# Patient Record
Sex: Female | Born: 1985 | Race: White | Hispanic: No | Marital: Married | Smoking: Never smoker
Health system: Southern US, Community
[De-identification: ages and names within clinical notes are randomized; demographics above are authoritative.]

## PROBLEM LIST (undated history)

## (undated) ENCOUNTER — Inpatient Hospital Stay (HOSPITAL_COMMUNITY): Payer: Self-pay

## (undated) DIAGNOSIS — Z789 Other specified health status: Secondary | ICD-10-CM

## (undated) DIAGNOSIS — E237 Disorder of pituitary gland, unspecified: Secondary | ICD-10-CM

## (undated) DIAGNOSIS — D649 Anemia, unspecified: Secondary | ICD-10-CM

## (undated) HISTORY — PX: WISDOM TOOTH EXTRACTION: SHX21

---

## 2015-06-03 LAB — OB RESULTS CONSOLE ABO/RH: RH Type: POSITIVE

## 2015-06-03 LAB — OB RESULTS CONSOLE HIV ANTIBODY (ROUTINE TESTING): HIV: NONREACTIVE

## 2015-06-03 LAB — OB RESULTS CONSOLE RUBELLA ANTIBODY, IGM: Rubella: IMMUNE

## 2015-06-03 LAB — OB RESULTS CONSOLE RPR: RPR: NONREACTIVE

## 2015-06-03 LAB — OB RESULTS CONSOLE HEPATITIS B SURFACE ANTIGEN: Hepatitis B Surface Ag: NEGATIVE

## 2015-06-03 LAB — OB RESULTS CONSOLE ANTIBODY SCREEN: ANTIBODY SCREEN: NEGATIVE

## 2015-10-18 ENCOUNTER — Inpatient Hospital Stay (HOSPITAL_COMMUNITY): Admission: AD | Admit: 2015-10-18 | Payer: 59 | Source: Ambulatory Visit | Admitting: Obstetrics and Gynecology

## 2015-10-18 ENCOUNTER — Inpatient Hospital Stay (HOSPITAL_COMMUNITY): Payer: 59

## 2015-10-18 ENCOUNTER — Encounter (HOSPITAL_COMMUNITY): Payer: Self-pay | Admitting: *Deleted

## 2015-10-18 ENCOUNTER — Inpatient Hospital Stay (HOSPITAL_COMMUNITY)
Admission: AD | Admit: 2015-10-18 | Discharge: 2015-10-23 | DRG: 781 | Disposition: A | Discharge status: Home / Self Care | Payer: 59 | Source: Ambulatory Visit | Attending: Obstetrics and Gynecology | Admitting: Obstetrics and Gynecology

## 2015-10-18 DIAGNOSIS — O4403 Placenta previa specified as without hemorrhage, third trimester: Principal | ICD-10-CM | POA: Diagnosis present

## 2015-10-18 DIAGNOSIS — Z3A28 28 weeks gestation of pregnancy: Secondary | ICD-10-CM | POA: Diagnosis not present

## 2015-10-18 DIAGNOSIS — O4413 Placenta previa with hemorrhage, third trimester: Secondary | ICD-10-CM

## 2015-10-18 DIAGNOSIS — O4400 Placenta previa specified as without hemorrhage, unspecified trimester: Secondary | ICD-10-CM | POA: Diagnosis present

## 2015-10-18 HISTORY — DX: Other specified health status: Z78.9

## 2015-10-18 LAB — TYPE AND SCREEN
ABO/RH(D): B POS
Antibody Screen: NEGATIVE

## 2015-10-18 LAB — URINALYSIS, ROUTINE W REFLEX MICROSCOPIC
BILIRUBIN URINE: NEGATIVE
Glucose, UA: NEGATIVE mg/dL
HGB URINE DIPSTICK: NEGATIVE
Ketones, ur: NEGATIVE mg/dL
Leukocytes, UA: NEGATIVE
NITRITE: NEGATIVE
PROTEIN: NEGATIVE mg/dL
Specific Gravity, Urine: 1.01 (ref 1.005–1.030)
pH: 7 (ref 5.0–8.0)

## 2015-10-18 MED ORDER — CALCIUM CARBONATE ANTACID 500 MG PO CHEW: 2.0000 | CHEWABLE_TABLET | ORAL | Status: DC | PRN

## 2015-10-18 MED ORDER — BETAMETHASONE SOD PHOS & ACET 6 (3-3) MG/ML IJ SUSP
12.0000 mg | INTRAMUSCULAR | Status: AC
  Filled 2015-10-18 (×2): qty 2

## 2015-10-18 MED ORDER — PRENATAL MULTIVITAMIN CH
1.0000 | ORAL_TABLET | Freq: Every day | ORAL | Status: DC
  Administered 2015-10-19 – 2015-10-22 (×4): 1 via ORAL
  Filled 2015-10-18 (×4): qty 1

## 2015-10-18 MED ORDER — ZOLPIDEM TARTRATE 5 MG PO TABS: 5.0000 mg | ORAL_TABLET | Freq: Every evening | ORAL | Status: DC | PRN

## 2015-10-18 MED ORDER — SODIUM CHLORIDE 0.9 % IV SOLN: 250.0000 mL | INTRAVENOUS | Status: DC | PRN

## 2015-10-18 MED ORDER — SODIUM CHLORIDE 0.9 % IJ SOLN
3.0000 mL | Freq: Two times a day (BID) | INTRAMUSCULAR | Status: DC
  Administered 2015-10-18 – 2015-10-22 (×9): 3 mL via INTRAVENOUS

## 2015-10-18 MED ORDER — DOCUSATE SODIUM 100 MG PO CAPS
100.0000 mg | ORAL_CAPSULE | Freq: Every day | ORAL | Status: DC
  Administered 2015-10-18 – 2015-10-22 (×5): 100 mg via ORAL
  Filled 2015-10-18 (×6): qty 1

## 2015-10-18 MED ORDER — ACETAMINOPHEN 325 MG PO TABS: 650.0000 mg | ORAL_TABLET | ORAL | Status: DC | PRN

## 2015-10-18 MED ORDER — SODIUM CHLORIDE 0.9 % IJ SOLN: 3.0000 mL | INTRAMUSCULAR | Status: DC | PRN

## 2015-10-18 NOTE — H&P (Addendum)
Ellen Bates is a 29 y.o. female G1P0 @ 28+5 wks with placenta previa presenting for vaginal bleeding.  Pt has had occasional spotting but had an episode of small bleeding this am when using BR followed by small clot.  No ctx, pain, or LOF. History OB History    Gravida Para Term Preterm AB TAB SAB Ectopic Multiple Living   1              Past Medical History  Diagnosis Date  . Medical history non-contributory    Past Surgical History  Procedure Laterality Date  . Wisdom tooth extraction     Family History: family history includes Hyperlipidemia in her mother. Social History:  reports that she has never smoked. She has never used smokeless tobacco. She reports that she does not drink alcohol or use illicit drugs.   Prenatal Transfer Tool  Maternal Diabetes: No Genetic Screening: Declined Maternal Ultrasounds/Referrals: Normal Fetal Ultrasounds or other Referrals:  None Maternal Substance Abuse:  No Significant Maternal Medications:  None Significant Maternal Lab Results:  None Other Comments:  None  ROS    Blood pressure 115/66, pulse 81, temperature 98.4 F (36.9 C), resp. rate 18. Exam Physical Exam  Gen - NAD Abd - gravid, NT Pv - deferred, no active bleeding Ext - NT Prenatal labs: ABO, Rh:  Rh + Antibody:   Rubella:   RPR:    HBsAg:    HIV:    GBS:     Assessment/Plan: 28 +5 weeks with complete placenta previa.  Vaginal bleeding today Admit BMZ Plan of care discussed with pt and husband    Ellen Bates 10/18/2015, 9:04 PM

## 2015-10-18 NOTE — MAU Note (Signed)
Hx of placenta previa. About 0900 today had episode of bright blood when going to BR and then small clot on tissue. Rest of day has been more brown on tissue. Was out of town so could not come in earlier. No pain. Good FM

## 2015-10-18 NOTE — Progress Notes (Signed)
Alvino ChapelJo RN went in to administer betamethasone, Pt wants to talk with Dr Renaldo FiddlerAdkins first about betamethasone.  Will notify her.

## 2015-10-18 NOTE — MAU Provider Note (Signed)
History     CSN: [id number]  Arrival date and time: 10/18/15 [id number]   First Provider Initiated Contact with Patient 10/18/15 2017      Chief Complaint  Patient presents with  . Vaginal Bleeding   HPI [name] is a 29 y.o. G1P0 at 6760w5d who presents to MAU today with complaint of vaginal bleeding. She states that she has a complete previa diagnosed at 18 weeks. The states only intermittent spotting prior to today. This morning she noted blood in the toilet and then a small amount of wiping. The next time she used the bathroom she noted a nickel sized clots with wiping. She has not required a pad. She continues to see intermittent brown blood with wiping since then. She denies pain, contractions or LOF. She reports good fetal movement.   OB History    Gravida Para Term Preterm AB TAB SAB Ectopic Multiple Living   1               Past Medical History  Diagnosis Date  . Medical history non-contributory     Past Surgical History  Procedure Laterality Date  . Wisdom tooth extraction      Family History  Problem Relation Age of Onset  . Hyperlipidemia Mother     Social History  Substance Use Topics  . Smoking status: Never Smoker   . Smokeless tobacco: Never Used  . Alcohol Use: No    Allergies:  Allergies  Allergen Reactions  . Sulfa Antibiotics Hives    Prescriptions prior to admission  Medication Sig Dispense Refill Last Dose  . Prenatal Vit-Fe Fumarate-FA (PRENATAL MULTIVITAMIN) TABS tablet Take 1 tablet by mouth daily at 12 noon.   10/17/2015 at Unknown time    Review of Systems  Gastrointestinal: Negative for abdominal pain.  Genitourinary:       + vaginal bleeding   Physical Exam   Blood pressure 115/66, pulse 81, temperature 98.4 F (36.9 C), resp. rate 18.  Physical Exam  Nursing note and vitals reviewed. Constitutional: She is oriented to person, place, and time. She appears well-developed and well-nourished. No distress.  HENT:  Head:  Normocephalic and atraumatic.  Cardiovascular: Normal rate.   Respiratory: Effort normal.  GI: Soft. She exhibits no distension and no mass. There is no tenderness. There is no rebound and no guarding.  Neurological: She is alert and oriented to person, place, and time.  Skin: Skin is warm and dry. No erythema.  Psychiatric: She has a normal mood and affect.   Results for orders placed or performed during the hospital encounter of 10/18/15 (from the past 24 hour(s))  Urinalysis, Routine w reflex microscopic ([facility])     Status: None   Collection Time: 10/18/15  8:20 PM  Result Value Ref Range   Color, Urine YELLOW YELLOW   APPearance CLEAR CLEAR   Specific Gravity, Urine 1.010 1.005 - 1.030   pH 7.0 5.0 - 8.0   Glucose, UA NEGATIVE NEGATIVE mg/dL   Hgb urine dipstick NEGATIVE NEGATIVE   Bilirubin Urine NEGATIVE NEGATIVE   Ketones, ur NEGATIVE NEGATIVE mg/dL   Protein, ur NEGATIVE NEGATIVE mg/dL   Nitrite NEGATIVE NEGATIVE   Leukocytes, UA NEGATIVE NEGATIVE    Fetal Monitoring: Baseline: 150 bpm, moderate variability, + accelerations, no decelerations Contractions: moderate UI with irregular contractions; only mild UI after 2030   MAU Course  Procedures None  MDM UA today Patient encouraged to PO hydrate in MAU Pelvic exam deferred due to  placenta previa Discussed with Dr. Renaldo Fiddler. Admit to Antenatal for monitoring of bleeding due to first bleed with placenta previa during this pregnancy.   Assessment and Plan  A: SIUP at [redacted]w[redacted]d Complete placenta previa  P: Admit to Antenatal Dr. Renaldo Fiddler will enter orders Patient informed Plan for BMZ  Marny Lowenstein, PA-C  10/18/2015, 9:10 PM

## 2015-10-18 NOTE — Progress Notes (Signed)
Dr Renaldo FiddlerAdkins spoke with pt regarding plan of care, betamethasone. Pt and husband wanted to speak further alone.  Per Dr Renaldo FiddlerAdkins- can let her know in am if pt received injection. Pt to u/s at this time and hasn't decided yet.  Notified Ruthe RN so she can let MD know in am.

## 2015-10-19 LAB — ABO/RH: ABO/RH(D): B POS

## 2015-10-19 LAB — CBC
HCT: 30.1 % — ABNORMAL LOW (ref 36.0–46.0)
Hemoglobin: 10.1 g/dL — ABNORMAL LOW (ref 12.0–15.0)
MCH: 30.6 pg (ref 26.0–34.0)
MCHC: 33.6 g/dL (ref 30.0–36.0)
MCV: 91.2 fL (ref 78.0–100.0)
PLATELETS: 135 10*3/uL — AB (ref 150–400)
RBC: 3.3 MIL/uL — ABNORMAL LOW (ref 3.87–5.11)
RDW: 12.7 % (ref 11.5–15.5)
WBC: 9.7 10*3/uL (ref 4.0–10.5)

## 2015-10-19 NOTE — Progress Notes (Signed)
Pt resting comfortably.  No vb, pain, or LOF.  Good FM Pt reports episode of mild contractions approx 130am that resolved w/ void  AF, VSS Gen - NAD Abd - gravid, NT PV - deferred  A/P:  Placenta previa, 28+6 weeks Pt stable w/ no further bleeding Pt declines BMZ Continue hospital bedrest

## 2015-10-20 NOTE — Progress Notes (Signed)
No current c/o.  No more VB or CTX.  Active FM.  VSS Gen: A&O x 3 Abd: soft, NT Ext: no c/c/e FHT Cat I  29 yo G1 at 8796w0d with previa -Counseled again re: rec to administer BMZ.  Patient declines until she has spoken with NICU. -NICU consult -Bedrest -Inpt thru 5-7 days after bleed (11/26)  Mitchel HonourMegan Rolf Fells, DO

## 2015-10-20 NOTE — Consult Note (Signed)
Neonatology Consult to Antenatal Patient: 10/20/2015 10:08 AM    I was requested by Dr Langston MaskerMorris to see this patient in order to provide antenatal counseling due to  Placenta previa in a 28 6/[redacted] weeks gestation.    Ellen Bates is a 29 y/o Primgravida who was admitted on 11/26 and is now 28 6/[redacted] weeks GA.   She is currently "not" having active labor.  She has not received BMZ at the moment but this has been offered to them since admission.   I spoke with Ms.Ishibashi and FOB in Room 152.   We discussed in detail what to expect in case of possible delivery of the infant in the next few days including morbidity and mortality at this gestational age, usual delivery room resuscitation including intubation and surfactant administration in the DR.  Discussed possible respiratory complications and need for support including mechanical ventilation and the importance of mother receiving antenatal steroids right now.  Emphasized the importance of steroids for lung maturity and improved morbidity and mortality rates.  Also informed them of other complications including IV access, sepsis work-up, NG/OG feedings ( benefits of BF and MOB desires breast feeding, which was encouraged as well as availability of DBM), risk for IVH with the potential for motor/cognitive deficits, length of stay and long-term outcome.  They were attentive, had appropriate questions more about antenatal steroids, and expressed appreciation for my input.     Thank you for asking us to see this patient and allowing us to participate in her care.  Please call if there are any further questions.   Overton MamMary Ann T Dimaguila, MD (Attending Neonatologist)  Total length of face-to-face or floor/unit time for this encounter was 30 minutes. Counseling and/or coordination of care was greater than fifty percent of the time.

## 2015-10-21 LAB — TYPE AND SCREEN
ABO/RH(D): B POS
ANTIBODY SCREEN: NEGATIVE

## 2015-10-21 NOTE — Progress Notes (Signed)
Patient is resting. Reports good fetal movement. Denies any vaginal bleeding since admission.  BP 114/60 mmHg  Pulse 80  Temp(Src) 98 F (36.7 C) (Oral)  Resp 18  Ht 5\' 7"  (1.702 m)  Wt 136 lb (61.689 kg)  BMI 21.30 kg/m2 No results found for this or any previous visit (from the past 24 hour(s)). Abdomen is soft and non tender  No vaginal bleeding  IMPRESSION: IUP at 29 w 1 day Placenta Previa - status post small bleed on November 26  PLAN: Patient currently stable Patient has spoken to Dr. Rana SnareLowe, Dr. Renaldo FiddlerAdkins, Dr. Langston MaskerMorris, NICU and myself and still declines steroids She does agree to steroids if she has another episode of bleeding If stable plan discharge home on Thursday

## 2015-10-22 NOTE — Progress Notes (Signed)
29 2/7 wks  No bleeding, no pain  VSS Afeb FHT with accels  A/P: Posterior Previa-Stable         Probable D/C tomorrow

## 2015-10-23 NOTE — Discharge Instructions (Signed)
Pelvic Rest Pelvic rest is sometimes recommended for women when:   The placenta is partially or completely covering the opening of the cervix (placenta previa).  There is bleeding between the uterine wall and the amniotic sac in the first trimester (subchorionic hemorrhage).  The cervix begins to open without labor starting (incompetent cervix, cervical insufficiency).  The labor is too early (preterm labor). HOME CARE INSTRUCTIONS 1. Do not have sexual intercourse, stimulation, or an orgasm. 2. Do not use tampons, douche, or put anything in the vagina. 3. Do not lift anything over 10 pounds (4.5 kg). 4. Avoid strenuous activity or straining your pelvic muscles. SEEK MEDICAL CARE IF:  You have any vaginal bleeding during pregnancy. Treat this as a potential emergency.  You have cramping pain felt low in the stomach (stronger than menstrual cramps).  You notice vaginal discharge (watery, mucus, or bloody).  You have a low, dull backache.  There are regular contractions or uterine tightening. SEEK IMMEDIATE MEDICAL CARE IF: You have vaginal bleeding and have placenta previa.    This information is not intended to replace advice given to you by your health care provider. Make sure you discuss any questions you have with your health care provider.   Document Released: 03/05/2011 Document Revised: 01/31/2012 Document Reviewed: 05/12/2015 Elsevier Interactive Patient Education 2016 Else[employer] Inc[employer] Fetal Movement Counts Patient Name: __________________________________________________ Patient Due Date: ____________________ Performing a fetal movement count is highly recommended in high-risk pregnancies, but it is good for every pregnant woman to do. Your health care provider may ask you to start counting fetal movements at 28 weeks of the pregnancy. Fetal movements often increase:  After eating a full meal.  After physical activity.  After eating or drinking something sweet or  cold.  At rest. Pay attention to when you feel the baby is most active. This will help you notice a pattern of your baby's sleep and wake cycles and what factors contribute to an increase in fetal movement. It is important to perform a fetal movement count at the same time each day when your baby is normally most active.  HOW TO COUNT FETAL MOVEMENTS 5. Find a quiet and comfortable area to sit or lie down on your left side. Lying on your left side provides the best blood and oxygen circulation to your baby. 6. Write down the day and time on a sheet of paper or in a journal. 7. Start counting kicks, flutters, swishes, rolls, or jabs in a 2-hour period. You should feel at least 10 movements within 2 hours. 8. If you do not feel 10 movements in 2 hours, wait 2-3 hours and count again. Look for a change in the pattern or not enough counts in 2 hours. SEEK MEDICAL CARE IF:  You feel less than 10 counts in 2 hours, tried twice.  There is no movement in over an hour.  The pattern is changing or taking longer each day to reach 10 counts in 2 hours.  You feel the baby is not moving as he or she usually does. Date: ____________ Movements: ____________ Start time: ____________ Doreatha MartinFinish time: ____________  Date: ____________ Movements: ____________ Start time: ____________ Doreatha MartinFinish time: ____________ Date: ____________ Movements: ____________ Start time: ____________ Doreatha MartinFinish time: ____________ Date: ____________ Movements: ____________ Start time: ____________ Doreatha MartinFinish time: ____________ Date: ____________ Movements: ____________ Start time: ____________ Doreatha MartinFinish time: ____________ Date: ____________ Movements: ____________ Start time: ____________ Doreatha MartinFinish time: ____________ Date: ____________ Movements: ____________ Start time: ____________ Doreatha MartinFinish time: ____________ Date: ____________ Movements: ____________ Start  time: ____________ Doreatha Martin time: ____________  Date: ____________ Movements: ____________ Start  time: ____________ Doreatha Martin time: ____________ Date: ____________ Movements: ____________ Start time: ____________ Doreatha Martin time: ____________ Date: ____________ Movements: ____________ Start time: ____________ Doreatha Martin time: ____________ Date: ____________ Movements: ____________ Start time: ____________ Doreatha Martin time: ____________ Date: ____________ Movements: ____________ Start time: ____________ Doreatha Martin time: ____________ Date: ____________ Movements: ____________ Start time: ____________ Doreatha Martin time: ____________ Date: ____________ Movements: ____________ Start time: ____________ Doreatha Martin time: ____________  Date: ____________ Movements: ____________ Start time: ____________ Doreatha Martin time: ____________ Date: ____________ Movements: ____________ Start time: ____________ Doreatha Martin time: ____________ Date: ____________ Movements: ____________ Start time: ____________ Doreatha Martin time: ____________ Date: ____________ Movements: ____________ Start time: ____________ Doreatha Martin time: ____________ Date: ____________ Movements: ____________ Start time: ____________ Doreatha Martin time: ____________ Date: ____________ Movements: ____________ Start time: ____________ Doreatha Martin time: ____________ Date: ____________ Movements: ____________ Start time: ____________ Doreatha Martin time: ____________  Date: ____________ Movements: ____________ Start time: ____________ Doreatha Martin time: ____________ Date: ____________ Movements: ____________ Start time: ____________ Doreatha Martin time: ____________ Date: ____________ Movements: ____________ Start time: ____________ Doreatha Martin time: ____________ Date: ____________ Movements: ____________ Start time: ____________ Doreatha Martin time: ____________ Date: ____________ Movements: ____________ Start time: ____________ Doreatha Martin time: ____________ Date: ____________ Movements: ____________ Start time: ____________ Doreatha Martin time: ____________ Date: ____________ Movements: ____________ Start time: ____________ Doreatha Martin time: ____________   Date: ____________ Movements: ____________ Start time: ____________ Doreatha Martin time: ____________ Date: ____________ Movements: ____________ Start time: ____________ Doreatha Martin time: ____________ Date: ____________ Movements: ____________ Start time: ____________ Doreatha Martin time: ____________ Date: ____________ Movements: ____________ Start time: ____________ Doreatha Martin time: ____________ Date: ____________ Movements: ____________ Start time: ____________ Doreatha Martin time: ____________ Date: ____________ Movements: ____________ Start time: ____________ Doreatha Martin time: ____________ Date: ____________ Movements: ____________ Start time: ____________ Doreatha Martin time: ____________  Date: ____________ Movements: ____________ Start time: ____________ Doreatha Martin time: ____________ Date: ____________ Movements: ____________ Start time: ____________ Doreatha Martin time: ____________ Date: ____________ Movements: ____________ Start time: ____________ Doreatha Martin time: ____________ Date: ____________ Movements: ____________ Start time: ____________ Doreatha Martin time: ____________ Date: ____________ Movements: ____________ Start time: ____________ Doreatha Martin time: ____________ Date: ____________ Movements: ____________ Start time: ____________ Doreatha Martin time: ____________ Date: ____________ Movements: ____________ Start time: ____________ Doreatha Martin time: ____________  Date: ____________ Movements: ____________ Start time: ____________ Doreatha Martin time: ____________ Date: ____________ Movements: ____________ Start time: ____________ Doreatha Martin time: ____________ Date: ____________ Movements: ____________ Start time: ____________ Doreatha Martin time: ____________ Date: ____________ Movements: ____________ Start time: ____________ Doreatha Martin time: ____________ Date: ____________ Movements: ____________ Start time: ____________ Doreatha Martin time: ____________ Date: ____________ Movements: ____________ Start time: ____________ Doreatha Martin time: ____________ Date: ____________ Movements: ____________  Start time: ____________ Doreatha Martin time: ____________  Date: ____________ Movements: ____________ Start time: ____________ Doreatha Martin time: ____________ Date: ____________ Movements: ____________ Start time: ____________ Doreatha Martin time: ____________ Date: ____________ Movements: ____________ Start time: ____________ Doreatha Martin time: ____________ Date: ____________ Movements: ____________ Start time: ____________ Doreatha Martin time: ____________ Date: ____________ Movements: ____________ Start time: ____________ Doreatha Martin time: ____________ Date: ____________ Movements: ____________ Start time: ____________ Doreatha Martin time: ____________   This information is not intended to replace advice given to you by your health care provider. Make sure you discuss any questions you have with your health care provider.   Document Released: 12/08/2006 Document Revised: 11/29/2014 Document Reviewed: 09/04/2012 Elsevier Interactive Patient Education [employer]

## 2015-10-23 NOTE — Discharge Summary (Signed)
Physician Discharge Summary  Patient ID: Ellen CanardLauren Schult MRN: [id number] DOB/AGE: 30/12/1985 30 y.o.  Admit date: 10/18/2015 Discharge date: 10/23/2015  Admission Diagnoses:6922w3d IUP, post previa   Discharge Diagnoses: same Active Problems:   Placenta previa antepartum   Discharged Condition: good  Hospital Course: adm with 3rd TM bleeding which resolved, YS>>post plac previa, DECLINED BMZ, obsv for 5 days withoutb recurrence  Consults: MFM/NEO  Significant Diagnostic Studies: labs:  CBC    Component Value Date/Time   WBC 9.7 10/19/2015 0900   RBC 3.30* 10/19/2015 0900   HGB 10.1* 10/19/2015 0900   HCT 30.1* 10/19/2015 0900   PLT 135* 10/19/2015 0900   MCV 91.2 10/19/2015 0900   MCH 30.6 10/19/2015 0900   MCHC 33.6 10/19/2015 0900   RDW 12.7 10/19/2015 0900      Treatments: bedrest + obsv  Discharge Exam: Blood pressure 109/61, pulse 80, temperature 98.1 F (36.7 C), temperature source Oral, resp. rate 18, height 5\' 7"  (1.702 m), weight 133 lb 8 oz (60.555 kg). fundus soft FHR 142  Disposition:D/C home     Medication List    TAKE these medications        prenatal multivitamin Tabs tablet  Take 1 tablet by mouth daily at 12 noon.           Follow-up Information    Follow up with Turner DanielsLOWE,DAVID C, MD.   Specialty:  Obstetrics and Gynecology   [facility]:  [facility]   Contact information:   [address], [address] [place] [id number] [phone]       Signed: Meriel PicaHOLLAND,Brandonn Capelli M 10/23/2015, 7:57 AM

## 2015-11-20 ENCOUNTER — Inpatient Hospital Stay (HOSPITAL_COMMUNITY)
Admission: AD | Admit: 2015-11-20 | Discharge: 2015-11-20 | Disposition: A | Discharge status: Home / Self Care | Payer: 59 | Source: Ambulatory Visit | Attending: Obstetrics and Gynecology | Admitting: Obstetrics and Gynecology

## 2015-11-20 DIAGNOSIS — O4403 Placenta previa specified as without hemorrhage, third trimester: Secondary | ICD-10-CM | POA: Insufficient documentation

## 2015-11-20 DIAGNOSIS — Z3A00 Weeks of gestation of pregnancy not specified: Secondary | ICD-10-CM | POA: Diagnosis not present

## 2015-11-20 MED ORDER — BETAMETHASONE SOD PHOS & ACET 6 (3-3) MG/ML IJ SUSP
12.0000 mg | Freq: Once | INTRAMUSCULAR | Status: AC
  Administered 2015-11-20: 12 mg via INTRAMUSCULAR
  Filled 2015-11-20: qty 2

## 2015-11-20 NOTE — MAU Note (Signed)
Pt presents to MAU for betamethasone injection. PT is known placenta previa and is scheduled for C/S at [facility]36weeks

## 2015-11-21 ENCOUNTER — Inpatient Hospital Stay (HOSPITAL_COMMUNITY)
Admission: AD | Admit: 2015-11-21 | Discharge: 2015-11-21 | Disposition: A | Discharge status: Home / Self Care | Payer: 59 | Source: Ambulatory Visit | Attending: Obstetrics & Gynecology | Admitting: Obstetrics & Gynecology

## 2015-11-21 DIAGNOSIS — O4410 Placenta previa with hemorrhage, unspecified trimester: Secondary | ICD-10-CM | POA: Insufficient documentation

## 2015-11-21 MED ORDER — BETAMETHASONE SOD PHOS & ACET 6 (3-3) MG/ML IJ SUSP
12.0000 mg | Freq: Once | INTRAMUSCULAR | Status: AC
  Administered 2015-11-21: 12 mg via INTRAMUSCULAR
  Filled 2015-11-21: qty 2

## 2015-11-26 ENCOUNTER — Inpatient Hospital Stay (HOSPITAL_COMMUNITY)
Admission: AD | Admit: 2015-11-26 | Discharge: 2015-11-27 | DRG: 782 | Disposition: A | Discharge status: Home / Self Care | Payer: 59 | Source: Ambulatory Visit | Attending: Obstetrics and Gynecology | Admitting: Obstetrics and Gynecology

## 2015-11-26 ENCOUNTER — Encounter (HOSPITAL_COMMUNITY): Payer: Self-pay

## 2015-11-26 ENCOUNTER — Inpatient Hospital Stay (HOSPITAL_COMMUNITY): Payer: 59

## 2015-11-26 DIAGNOSIS — O26853 Spotting complicating pregnancy, third trimester: Secondary | ICD-10-CM | POA: Diagnosis present

## 2015-11-26 DIAGNOSIS — O4403 Placenta previa specified as without hemorrhage, third trimester: Secondary | ICD-10-CM | POA: Diagnosis present

## 2015-11-26 DIAGNOSIS — Z3A34 34 weeks gestation of pregnancy: Secondary | ICD-10-CM

## 2015-11-26 DIAGNOSIS — O4693 Antepartum hemorrhage, unspecified, third trimester: Secondary | ICD-10-CM

## 2015-11-26 LAB — CBC
HCT: 32.9 % — ABNORMAL LOW (ref 36.0–46.0)
Hemoglobin: 11.2 g/dL — ABNORMAL LOW (ref 12.0–15.0)
MCH: 30.4 pg (ref 26.0–34.0)
MCHC: 34 g/dL (ref 30.0–36.0)
MCV: 89.4 fL (ref 78.0–100.0)
PLATELETS: 129 10*3/uL — AB (ref 150–400)
RBC: 3.68 MIL/uL — ABNORMAL LOW (ref 3.87–5.11)
RDW: 13.1 % (ref 11.5–15.5)
WBC: 12.9 10*3/uL — ABNORMAL HIGH (ref 4.0–10.5)

## 2015-11-26 LAB — TYPE AND SCREEN
ABO/RH(D): B POS
Antibody Screen: NEGATIVE

## 2015-11-26 MED ORDER — PRENATAL MULTIVITAMIN CH
1.0000 | ORAL_TABLET | Freq: Every day | ORAL | Status: DC
  Filled 2015-11-26: qty 1

## 2015-11-26 MED ORDER — ACETAMINOPHEN 325 MG PO TABS: 650.0000 mg | ORAL_TABLET | ORAL | Status: DC | PRN

## 2015-11-26 MED ORDER — LACTATED RINGERS IV SOLN
INTRAVENOUS | Status: DC
  Administered 2015-11-27: via INTRAVENOUS

## 2015-11-26 MED ORDER — ZOLPIDEM TARTRATE 5 MG PO TABS: 5.0000 mg | ORAL_TABLET | Freq: Every evening | ORAL | Status: DC | PRN

## 2015-11-26 MED ORDER — TERBUTALINE SULFATE 1 MG/ML IJ SOLN
0.2500 mg | Freq: Once | INTRAMUSCULAR | Status: DC
  Filled 2015-11-26: qty 1

## 2015-11-26 MED ORDER — CALCIUM CARBONATE ANTACID 500 MG PO CHEW
2.0000 | CHEWABLE_TABLET | ORAL | Status: DC | PRN
  Filled 2015-11-26: qty 2

## 2015-11-26 NOTE — MAU Note (Signed)
Felt a gush of bright red blood at 0230 with 1 golf ball size clot.  Bleeding stopped.  Called office at 1 pm and was told to go to hospital for monitoring or watch bleeding over the day and come if bleeding returned.  Bleeding returned at 4:30 pm when wiped and saw a dime size clot. Felt some contractions on and off through day.  Baby moving well.

## 2015-11-26 NOTE — MAU Provider Note (Signed)
  History     CSN: [id number]  Arrival date and time: 11/26/15 2039   First Provider Initiated Contact with Patient 11/26/15 2132      Chief Complaint  Patient presents with  . Vaginal Bleeding   Vaginal Bleeding The patient's primary symptoms include vaginal bleeding. This is a new problem. The current episode started today. The problem occurs intermittently. The problem has been unchanged. The patient is experiencing no pain. The problem affects both sides. She is pregnant. The vaginal bleeding is lighter than menses. She has been passing clots (one about the size of a golf ball, and one about the size of a nickel. ). Nothing aggravates the symptoms. She has tried nothing for the symptoms. Sexual activity: pelvic rest     Past Medical History  Diagnosis Date  . Medical history non-contributory     Past Surgical History  Procedure Laterality Date  . Wisdom tooth extraction      Family History  Problem Relation Age of Onset  . Hyperlipidemia Mother     Social History  Substance Use Topics  . Smoking status: Never Smoker   . Smokeless tobacco: Never Used  . Alcohol Use: No    Allergies:  Allergies  Allergen Reactions  . Sulfa Antibiotics Hives    Prescriptions prior to admission  Medication Sig Dispense Refill Last Dose  . Prenatal Vit-Fe Fumarate-FA (PRENATAL MULTIVITAMIN) TABS tablet Take 1 tablet by mouth daily at 12 noon.   11/26/2015 at Unknown time    Review of Systems  Genitourinary: Positive for vaginal bleeding.   Physical Exam   Blood pressure 104/61, pulse 96, temperature 98 F (36.7 C), temperature source Oral, resp. rate 16, SpO2 97 %.  Physical Exam  Nursing note and vitals reviewed. Constitutional: She is oriented to person, place, and time. She appears well-developed and well-nourished. No distress.  HENT:  Head: Normocephalic.  Cardiovascular: Normal rate.   Respiratory: Effort normal.  GI: Soft. There is no tenderness. There is no rebound.   Genitourinary:   External: no lesion Vagina: small amount of brown blood seen  Cervix: pink, smooth, visually closed  Uterus: AGA    Neurological: She is alert and oriented to person, place, and time.  Skin: Skin is warm and dry.  Psychiatric: She has a normal mood and affect.   FHT: 145, moderate with 15x15 accels, no decels Toco: q 3-4 mins  MAU Course  Procedures  MDM 2146: D/W Dr. Marcelle OverlieHolland, will get CBC, Type and Screen, US, and give dose of terb.  Patient refused terbutaline  2314: Back from US, on contraction on the monitor since monitors applied. One variable with contraction. 2318: D/W Dr. Marcelle OverlieHolland. Orders for admission.  Assessment and Plan   1. Placenta previa, third trimester   2. Vaginal bleeding in pregnancy, third trimester    Antenatal for OBS Keep NPO Repeat CBC in AM   Tawnya CrookHogan, Heather Donovan 11/26/2015, 9:35 PM

## 2015-11-27 ENCOUNTER — Encounter (HOSPITAL_COMMUNITY): Payer: Self-pay | Admitting: *Deleted

## 2015-11-27 LAB — URINALYSIS, ROUTINE W REFLEX MICROSCOPIC
Bilirubin Urine: NEGATIVE
Glucose, UA: NEGATIVE mg/dL
KETONES UR: NEGATIVE mg/dL
LEUKOCYTES UA: NEGATIVE
NITRITE: NEGATIVE
PH: 6.5 (ref 5.0–8.0)
Protein, ur: NEGATIVE mg/dL
SPECIFIC GRAVITY, URINE: 1.01 (ref 1.005–1.030)

## 2015-11-27 LAB — CBC WITH DIFFERENTIAL/PLATELET
BASOS PCT: 0 %
Basophils Absolute: 0 10*3/uL (ref 0.0–0.1)
EOS ABS: 0.1 10*3/uL (ref 0.0–0.7)
Eosinophils Relative: 1 %
HCT: 32 % — ABNORMAL LOW (ref 36.0–46.0)
HEMOGLOBIN: 10.9 g/dL — AB (ref 12.0–15.0)
Lymphocytes Relative: 26 %
Lymphs Abs: 3.1 10*3/uL (ref 0.7–4.0)
MCH: 30.6 pg (ref 26.0–34.0)
MCHC: 34.1 g/dL (ref 30.0–36.0)
MCV: 89.9 fL (ref 78.0–100.0)
MONOS PCT: 7 %
Monocytes Absolute: 0.8 10*3/uL (ref 0.1–1.0)
NEUTROS PCT: 67 %
Neutro Abs: 8.1 10*3/uL — ABNORMAL HIGH (ref 1.7–7.7)
PLATELETS: 116 10*3/uL — AB (ref 150–400)
RBC: 3.56 MIL/uL — ABNORMAL LOW (ref 3.87–5.11)
RDW: 13.2 % (ref 11.5–15.5)
WBC: 12.1 10*3/uL — ABNORMAL HIGH (ref 4.0–10.5)

## 2015-11-27 LAB — URINE MICROSCOPIC-ADD ON

## 2015-11-27 NOTE — H&P (Signed)
Ellen CanardLauren Bates is a 30 y.o. female presenting for spotting earlier in the day, hx previa. Maternal Medical History:  Reason for admission: Vaginal bleeding.   Contractions: Onset was 3-5 hours ago.   Frequency: irregular.   Perceived severity is mild.    Fetal activity: Perceived fetal activity is normal.   Last perceived fetal movement was within the past hour.      OB History    Gravida Para Term Preterm AB TAB SAB Ectopic Multiple Living   1              Past Medical History  Diagnosis Date  . Medical history non-contributory    Past Surgical History  Procedure Laterality Date  . Wisdom tooth extraction     Family History: family history includes Hyperlipidemia in her mother. Social History:  reports that she has never smoked. She has never used smokeless tobacco. She reports that she does not drink alcohol or use illicit drugs.   Prenatal Transfer Tool  Maternal Diabetes: No Genetic Screening: Normal Maternal Ultrasounds/Referrals: Abnormal:  Findings:   Other:PREVIA Fetal Ultrasounds or other Referrals:  None Maternal Substance Abuse:  No Significant Maternal Medications:  None Significant Maternal Lab Results:  None Other Comments:  None  ROS    Blood pressure 112/64, pulse 85, temperature 97.9 F (36.6 C), temperature source Oral, resp. rate 16, height 5\' 7"  (1.702 Bates), weight 136 lb (61.689 kg), SpO2 99 %. Exam Physical Exam  Constitutional: She is oriented to person, place, and time. She appears well-developed and well-nourished.  HENT:  Head: Normocephalic and atraumatic.  Neck: Normal range of motion. Neck supple.  Cardiovascular: Normal rate and regular rhythm.   Respiratory: Effort normal and breath sounds normal.  GI:  Fundus soft, FHR 138  Genitourinary:  Deferred, min sleeding  Musculoskeletal: Normal range of motion.  Neurological: She is oriented to person, place, and time.    Prenatal labs: ABO, Rh: --/--/B POS (01/04 2150) Antibody: NEG  (01/04 2150) Rubella:   RPR:    HBsAg:    HIV:    GBS:     Assessment/Plan: 1335w3d, known post placenta previa, minimal bleeding, now resolved.  She is sched for Primary CS 1/20, will adm for overnight eval CBC    Component Value Date/Time   WBC 12.9* 11/26/2015 2150   RBC 3.68* 11/26/2015 2150   HGB 11.2* 11/26/2015 2150   HCT 32.9* 11/26/2015 2150   PLT 129* 11/26/2015 2150   MCV 89.4 11/26/2015 2150   MCH 30.4 11/26/2015 2150   MCHC 34.0 11/26/2015 2150   RDW 13.1 11/26/2015 2150        Ellen Bates 11/27/2015, 6:59 AM

## 2015-11-27 NOTE — Progress Notes (Signed)
Patient is doing well. No bleeding. Denies contractions. Reports good fetal movement.  Afebrile Vital signs stable Abdomen is soft and non tender  Will discharge patient home Modified bedrest Bleeding precautions

## 2015-11-27 NOTE — Discharge Summary (Signed)
  Admission Diagnosis: IUP at 34 w 2 days Placenta Previa Spotting  Discharge Diagnosis: Same  Hospital Course: 30 year old female at 5334 w 2 days admitted with vaginal spotting. Since she has a placenta previa she was admitted and placed on bedrest She was observed overnight and no abdominal pain, vaginal bleeding or fetal distress noted. She was discharged home in good condition. She was advised on bleeding precautions  She will follow up next week in the office.

## 2015-12-03 NOTE — H&P (Signed)
Ellen Bates is a 30 y.o. female presenting for primary c/s for placenta previa.  Has had hospitalization for bleeding and received BMZ series.  Pregnancy otherwise uncomplicated. History OB History    Gravida Para Term Preterm AB TAB SAB Ectopic Multiple Living   1              Past Medical History  Diagnosis Date  . Medical history non-contributory    Past Surgical History  Procedure Laterality Date  . Wisdom tooth extraction     Family History: family history includes Hyperlipidemia in her mother. Social History:  reports that she has never smoked. She has never used smokeless tobacco. She reports that she does not drink alcohol or use illicit drugs.   Prenatal Transfer Tool  Maternal Diabetes: No Genetic Screening: Normal Maternal Ultrasounds/Referrals: Normal except posterior previa Fetal Ultrasounds or other Referrals:  None Maternal Substance Abuse:  No Significant Maternal Medications:  None Significant Maternal Lab Results:  None Other Comments:  None  ROS    There were no vitals taken for this visit. Exam Physical Exam  Prenatal labs: ABO, Rh: --/--/B POS (01/04 2150) Antibody: NEG (01/04 2150) Rubella:   RPR:    HBsAg:    HIV:    GBS:     Assessment/Plan: IUP at 36 1/2 weeks Complete placenta previa for primary C/S Risks and benefits of C/S were discussed.  All questions were answered and informed consent was obtained.  Plan to proceed with low segment transverse Cesarean Section.    Arlyne Brandes C 12/03/2015, 9:04 AM

## 2015-12-10 ENCOUNTER — Encounter (HOSPITAL_COMMUNITY): Payer: Self-pay

## 2015-12-10 ENCOUNTER — Encounter (HOSPITAL_COMMUNITY)
Admission: RE | Admit: 2015-12-10 | Discharge: 2015-12-10 | Disposition: A | Discharge status: Home / Self Care | Payer: 59 | Source: Ambulatory Visit | Attending: Obstetrics and Gynecology | Admitting: Obstetrics and Gynecology

## 2015-12-10 HISTORY — DX: Anemia, unspecified: D64.9

## 2015-12-10 LAB — CBC
HCT: 33 % — ABNORMAL LOW (ref 36.0–46.0)
Hemoglobin: 11.4 g/dL — ABNORMAL LOW (ref 12.0–15.0)
MCH: 30.8 pg (ref 26.0–34.0)
MCHC: 34.5 g/dL (ref 30.0–36.0)
MCV: 89.2 fL (ref 78.0–100.0)
PLATELETS: 89 10*3/uL — AB (ref 150–400)
RBC: 3.7 MIL/uL — ABNORMAL LOW (ref 3.87–5.11)
RDW: 13.6 % (ref 11.5–15.5)
WBC: 7.8 10*3/uL (ref 4.0–10.5)

## 2015-12-10 NOTE — Patient Instructions (Signed)
Your procedure is scheduled on: December 12, 2015  Enter through the Main Entrance of [facility] at: 6:00 am   Pick up the phone at the desk and dial [phone].  Call this number if you have problems the morning of surgery: [phone].  Remember: Do NOT eat food: after midnight on Thursday  Do NOT drink clear liquids after:  Midnight on Thursday  Take these medicines the morning of surgery with a SIP OF WATER: none   Do NOT wear jewelry (body piercing), metal hair clips/bobby pins, or nail polish. Do NOT wear lotions, powders, or perfumes.  You may wear deoderant. Do NOT shave for 48 hours prior to surgery. Do NOT bring valuables to the hospital. Contacts, dentures, or bridgework may not be worn into surgery. Leave suitcase in car.  After surgery it may be brought to your room.  For patients admitted to the hospital, checkout time is 11:00 AM the day of discharge.

## 2015-12-10 NOTE — Pre-Procedure Instructions (Signed)
Patient had low platelet count today at PAT appointment. Spoke with Dr. Arby Barrette and made him aware of results. Orders received.

## 2015-12-10 NOTE — Pre-Procedure Instructions (Signed)
Spoke with patient and made her aware per Dr. Arby Barrette that she could possibly have to have general anesthesia for her surgery due to low platelet count and that we would recheck labs on Friday before surgery. Also made Heather at Dr. Vance Gather office aware of results.

## 2015-12-11 LAB — RPR: RPR Ser Ql: NONREACTIVE

## 2015-12-11 MED ORDER — DEXTROSE 5 % IV SOLN
2.0000 g | INTRAVENOUS | Status: AC
  Filled 2015-12-11: qty 2

## 2015-12-12 ENCOUNTER — Inpatient Hospital Stay (HOSPITAL_COMMUNITY)
Admission: RE | Admit: 2015-12-12 | Discharge: 2015-12-15 | DRG: 766 | Disposition: A | Discharge status: Home / Self Care | Payer: 59 | Source: Ambulatory Visit | Attending: Obstetrics and Gynecology | Admitting: Obstetrics and Gynecology

## 2015-12-12 ENCOUNTER — Encounter (HOSPITAL_COMMUNITY): Payer: Self-pay | Admitting: Registered Nurse

## 2015-12-12 ENCOUNTER — Inpatient Hospital Stay (HOSPITAL_COMMUNITY): Payer: 59 | Admitting: Anesthesiology

## 2015-12-12 ENCOUNTER — Encounter (HOSPITAL_COMMUNITY)
Admission: RE | Disposition: A | Discharge status: Home / Self Care | Payer: Self-pay | Source: Ambulatory Visit | Attending: Obstetrics and Gynecology

## 2015-12-12 DIAGNOSIS — Z3A36 36 weeks gestation of pregnancy: Secondary | ICD-10-CM

## 2015-12-12 DIAGNOSIS — O9902 Anemia complicating childbirth: Secondary | ICD-10-CM | POA: Diagnosis present

## 2015-12-12 DIAGNOSIS — Z8349 Family history of other endocrine, nutritional and metabolic diseases: Secondary | ICD-10-CM | POA: Diagnosis not present

## 2015-12-12 DIAGNOSIS — D696 Thrombocytopenia, unspecified: Secondary | ICD-10-CM | POA: Diagnosis present

## 2015-12-12 DIAGNOSIS — D649 Anemia, unspecified: Secondary | ICD-10-CM | POA: Diagnosis present

## 2015-12-12 DIAGNOSIS — O4413 Placenta previa with hemorrhage, third trimester: Secondary | ICD-10-CM | POA: Diagnosis present

## 2015-12-12 DIAGNOSIS — O4403 Placenta previa specified as without hemorrhage, third trimester: Secondary | ICD-10-CM | POA: Diagnosis present

## 2015-12-12 DIAGNOSIS — O4690 Antepartum hemorrhage, unspecified, unspecified trimester: Secondary | ICD-10-CM | POA: Diagnosis present

## 2015-12-12 LAB — PREPARE RBC (CROSSMATCH)

## 2015-12-12 LAB — CBC
HEMATOCRIT: 32.8 % — AB (ref 36.0–46.0)
HEMOGLOBIN: 11.5 g/dL — AB (ref 12.0–15.0)
MCH: 31.1 pg (ref 26.0–34.0)
MCHC: 35.1 g/dL (ref 30.0–36.0)
MCV: 88.6 fL (ref 78.0–100.0)
Platelets: 89 10*3/uL — ABNORMAL LOW (ref 150–400)
RBC: 3.7 MIL/uL — ABNORMAL LOW (ref 3.87–5.11)
RDW: 13.4 % (ref 11.5–15.5)
WBC: 9.1 10*3/uL (ref 4.0–10.5)

## 2015-12-12 SURGERY — Surgical Case
Anesthesia: Spinal

## 2015-12-12 MED ORDER — ZOLPIDEM TARTRATE 5 MG PO TABS: 5.0000 mg | ORAL_TABLET | Freq: Every evening | ORAL | Status: DC | PRN

## 2015-12-12 MED ORDER — LACTATED RINGERS IV SOLN
Freq: Once | INTRAVENOUS | Status: AC
Start: 2015-12-12 — End: 2015-12-12
  Administered 2015-12-12: 06:00:00 via INTRAVENOUS

## 2015-12-12 MED ORDER — MORPHINE SULFATE (PF) 0.5 MG/ML IJ SOLN
INTRAMUSCULAR | Status: DC | PRN
  Administered 2015-12-12: .1 mg via INTRATHECAL

## 2015-12-12 MED ORDER — HYDROMORPHONE HCL 1 MG/ML IJ SOLN
INTRAMUSCULAR | Status: AC
  Administered 2015-12-12: 0.25 mg via INTRAVENOUS
  Filled 2015-12-12: qty 1

## 2015-12-12 MED ORDER — PRENATAL MULTIVITAMIN CH
1.0000 | ORAL_TABLET | Freq: Every day | ORAL | Status: DC
  Administered 2015-12-13 – 2015-12-14 (×2): 1 via ORAL
  Filled 2015-12-12 (×2): qty 1

## 2015-12-12 MED ORDER — PHENYLEPHRINE 8 MG IN D5W 100 ML (0.08MG/ML) PREMIX OPTIME
INJECTION | INTRAVENOUS | Status: AC
  Filled 2015-12-12: qty 100

## 2015-12-12 MED ORDER — MENTHOL 3 MG MT LOZG: 1.0000 | LOZENGE | OROMUCOSAL | Status: DC | PRN

## 2015-12-12 MED ORDER — IBUPROFEN 600 MG PO TABS: 600.0000 mg | ORAL_TABLET | Freq: Four times a day (QID) | ORAL | Status: DC

## 2015-12-12 MED ORDER — OXYCODONE HCL 5 MG PO TABS
5.0000 mg | ORAL_TABLET | ORAL | Status: AC
  Administered 2015-12-12: 5 mg via ORAL
  Filled 2015-12-12: qty 1

## 2015-12-12 MED ORDER — ONDANSETRON HCL 4 MG/2ML IJ SOLN
INTRAMUSCULAR | Status: AC
  Filled 2015-12-12: qty 2

## 2015-12-12 MED ORDER — ONDANSETRON HCL 4 MG/2ML IJ SOLN
INTRAMUSCULAR | Status: DC | PRN
  Administered 2015-12-12: 4 mg via INTRAVENOUS

## 2015-12-12 MED ORDER — SIMETHICONE 80 MG PO CHEW
80.0000 mg | CHEWABLE_TABLET | Freq: Three times a day (TID) | ORAL | Status: DC
  Administered 2015-12-13 – 2015-12-15 (×5): 80 mg via ORAL
  Filled 2015-12-12 (×7): qty 1

## 2015-12-12 MED ORDER — LACTATED RINGERS IV SOLN: 2.5000 [IU]/h | INTRAVENOUS | Status: AC

## 2015-12-12 MED ORDER — SIMETHICONE 80 MG PO CHEW: 80.0000 mg | CHEWABLE_TABLET | ORAL | Status: DC | PRN

## 2015-12-12 MED ORDER — HYDROMORPHONE HCL 1 MG/ML IJ SOLN
INTRAMUSCULAR | Status: AC
  Filled 2015-12-12: qty 1

## 2015-12-12 MED ORDER — NALBUPHINE HCL 10 MG/ML IJ SOLN: 5.0000 mg | INTRAMUSCULAR | Status: DC | PRN

## 2015-12-12 MED ORDER — LACTATED RINGERS IV SOLN
40.0000 [IU] | INTRAVENOUS | Status: DC | PRN
  Administered 2015-12-12: 40 [IU] via INTRAVENOUS

## 2015-12-12 MED ORDER — PHENYLEPHRINE 8 MG IN D5W 100 ML (0.08MG/ML) PREMIX OPTIME
INJECTION | INTRAVENOUS | Status: DC | PRN
  Administered 2015-12-12: 40 ug/min via INTRAVENOUS

## 2015-12-12 MED ORDER — TETANUS-DIPHTH-ACELL PERTUSSIS 5-2.5-18.5 LF-MCG/0.5 IM SUSP: 0.5000 mL | Freq: Once | INTRAMUSCULAR | Status: DC

## 2015-12-12 MED ORDER — NALOXONE HCL 2 MG/2ML IJ SOSY
1.0000 ug/kg/h | PREFILLED_SYRINGE | INTRAVENOUS | Status: DC | PRN
  Filled 2015-12-12: qty 2

## 2015-12-12 MED ORDER — SCOPOLAMINE 1 MG/3DAYS TD PT72
1.0000 | MEDICATED_PATCH | Freq: Once | TRANSDERMAL | Status: DC
  Administered 2015-12-12: 1.5 mg via TRANSDERMAL

## 2015-12-12 MED ORDER — LANOLIN HYDROUS EX OINT: 1.0000 "application " | TOPICAL_OINTMENT | CUTANEOUS | Status: DC | PRN

## 2015-12-12 MED ORDER — MORPHINE SULFATE (PF) 0.5 MG/ML IJ SOLN
INTRAMUSCULAR | Status: AC
  Filled 2015-12-12: qty 10

## 2015-12-12 MED ORDER — LACTATED RINGERS IV SOLN
INTRAVENOUS | Status: DC
  Administered 2015-12-12: 18:00:00 via INTRAVENOUS

## 2015-12-12 MED ORDER — DIBUCAINE 1 % RE OINT: 1.0000 "application " | TOPICAL_OINTMENT | RECTAL | Status: DC | PRN

## 2015-12-12 MED ORDER — DIPHENHYDRAMINE HCL 25 MG PO CAPS: 25.0000 mg | ORAL_CAPSULE | Freq: Four times a day (QID) | ORAL | Status: DC | PRN

## 2015-12-12 MED ORDER — HYDROMORPHONE HCL 1 MG/ML IJ SOLN
0.2500 mg | INTRAMUSCULAR | Status: DC | PRN
  Administered 2015-12-12: 0.25 mg via INTRAVENOUS
  Administered 2015-12-12: 0.5 mg via INTRAVENOUS
  Administered 2015-12-12: 0.25 mg via INTRAVENOUS

## 2015-12-12 MED ORDER — NALOXONE HCL 0.4 MG/ML IJ SOLN: 0.4000 mg | INTRAMUSCULAR | Status: DC | PRN

## 2015-12-12 MED ORDER — LACTATED RINGERS IV SOLN
INTRAVENOUS | Status: DC
  Administered 2015-12-12 (×3): via INTRAVENOUS

## 2015-12-12 MED ORDER — ONDANSETRON HCL 4 MG/2ML IJ SOLN: 4.0000 mg | Freq: Three times a day (TID) | INTRAMUSCULAR | Status: DC | PRN

## 2015-12-12 MED ORDER — SCOPOLAMINE 1 MG/3DAYS TD PT72
MEDICATED_PATCH | TRANSDERMAL | Status: AC
  Administered 2015-12-12: 1.5 mg via TRANSDERMAL
  Filled 2015-12-12: qty 1

## 2015-12-12 MED ORDER — ACETAMINOPHEN 325 MG PO TABS
650.0000 mg | ORAL_TABLET | ORAL | Status: DC | PRN
Start: 2015-12-12 — End: 2015-12-15
  Administered 2015-12-13 – 2015-12-14 (×2): 650 mg via ORAL
  Filled 2015-12-12 (×2): qty 2

## 2015-12-12 MED ORDER — LACTATED RINGERS IV SOLN
INTRAVENOUS | Status: DC | PRN
  Administered 2015-12-12: 08:00:00 via INTRAVENOUS

## 2015-12-12 MED ORDER — ACETAMINOPHEN 500 MG PO TABS
1000.0000 mg | ORAL_TABLET | Freq: Four times a day (QID) | ORAL | Status: AC
  Administered 2015-12-12 – 2015-12-13 (×4): 1000 mg via ORAL
  Filled 2015-12-12 (×4): qty 2

## 2015-12-12 MED ORDER — OXYTOCIN 10 UNIT/ML IJ SOLN
INTRAMUSCULAR | Status: AC
  Filled 2015-12-12: qty 4

## 2015-12-12 MED ORDER — FENTANYL CITRATE (PF) 100 MCG/2ML IJ SOLN
INTRAMUSCULAR | Status: DC | PRN
  Administered 2015-12-12: 25 ug via INTRATHECAL

## 2015-12-12 MED ORDER — NALBUPHINE HCL 10 MG/ML IJ SOLN: 5.0000 mg | Freq: Once | INTRAMUSCULAR | Status: DC | PRN

## 2015-12-12 MED ORDER — SIMETHICONE 80 MG PO CHEW
80.0000 mg | CHEWABLE_TABLET | ORAL | Status: DC
  Administered 2015-12-13 – 2015-12-15 (×3): 80 mg via ORAL
  Filled 2015-12-12 (×3): qty 1

## 2015-12-12 MED ORDER — OXYCODONE HCL 5 MG PO TABS: 10.0000 mg | ORAL_TABLET | ORAL | Status: DC | PRN

## 2015-12-12 MED ORDER — SODIUM CHLORIDE 0.9 % IJ SOLN
3.0000 mL | INTRAMUSCULAR | Status: DC | PRN
  Administered 2015-12-12: 3 mL via INTRAVENOUS
  Filled 2015-12-12: qty 3

## 2015-12-12 MED ORDER — FENTANYL CITRATE (PF) 100 MCG/2ML IJ SOLN
INTRAMUSCULAR | Status: AC
  Filled 2015-12-12: qty 2

## 2015-12-12 MED ORDER — OXYCODONE HCL 5 MG PO TABS
5.0000 mg | ORAL_TABLET | ORAL | Status: DC | PRN
  Administered 2015-12-13: 5 mg via ORAL
  Filled 2015-12-12: qty 1

## 2015-12-12 MED ORDER — DEXTROSE 5 % IV SOLN
2.0000 g | INTRAVENOUS | Status: DC | PRN
  Administered 2015-12-12: 2 g via INTRAVENOUS

## 2015-12-12 MED ORDER — MEPERIDINE HCL 25 MG/ML IJ SOLN: 6.2500 mg | INTRAMUSCULAR | Status: DC | PRN

## 2015-12-12 MED ORDER — WITCH HAZEL-GLYCERIN EX PADS: 1.0000 "application " | MEDICATED_PAD | CUTANEOUS | Status: DC | PRN

## 2015-12-12 MED ORDER — SENNOSIDES-DOCUSATE SODIUM 8.6-50 MG PO TABS
2.0000 | ORAL_TABLET | ORAL | Status: DC
  Administered 2015-12-13 – 2015-12-15 (×3): 2 via ORAL
  Filled 2015-12-12 (×3): qty 2

## 2015-12-12 SURGICAL SUPPLY — 28 items
CLAMP CORD UMBIL (MISCELLANEOUS) IMPLANT
CLOTH BEACON ORANGE TIMEOUT ST (SAFETY) ×3 IMPLANT
DRAPE SHEET LG 3/4 BI-LAMINATE (DRAPES) IMPLANT
DRSG OPSITE POSTOP 4X10 (GAUZE/BANDAGES/DRESSINGS) ×3 IMPLANT
DURAPREP 26ML APPLICATOR (WOUND CARE) ×3 IMPLANT
ELECT REM PT RETURN 9FT ADLT (ELECTROSURGICAL) ×3
ELECTRODE REM PT RTRN 9FT ADLT (ELECTROSURGICAL) ×1 IMPLANT
EXTRACTOR VACUUM M CUP 4 TUBE (SUCTIONS) IMPLANT
EXTRACTOR VACUUM M CUP 4' TUBE (SUCTIONS)
GLOVE BIOGEL PI IND STRL 7.0 (GLOVE) ×1 IMPLANT
GLOVE BIOGEL PI INDICATOR 7.0 (GLOVE) ×2
GLOVE SURG ORTHO 8.0 STRL STRW (GLOVE) ×3 IMPLANT
GOWN STRL REUS W/TWL LRG LVL3 (GOWN DISPOSABLE) ×6 IMPLANT
KIT ABG SYR 3ML LUER SLIP (SYRINGE) ×3 IMPLANT
NEEDLE HYPO 25X5/8 SAFETYGLIDE (NEEDLE) ×3 IMPLANT
NS IRRIG 1000ML POUR BTL (IV SOLUTION) ×3 IMPLANT
PACK C SECTION WH (CUSTOM PROCEDURE TRAY) ×3 IMPLANT
PAD OB MATERNITY 4.3X12.25 (PERSONAL CARE ITEMS) ×3 IMPLANT
PENCIL SMOKE EVAC W/HOLSTER (ELECTROSURGICAL) ×3 IMPLANT
SUT MNCRL 0 VIOLET CTX 36 (SUTURE) ×3 IMPLANT
SUT MON AB 4-0 PS1 27 (SUTURE) ×3 IMPLANT
SUT MONOCRYL 0 CTX 36 (SUTURE) ×6
SUT PDS AB 1 CT  36 (SUTURE)
SUT PDS AB 1 CT 36 (SUTURE) IMPLANT
SUT VIC AB 1 CTX 36 (SUTURE) ×2
SUT VIC AB 1 CTX36XBRD ANBCTRL (SUTURE) ×1 IMPLANT
TOWEL OR 17X24 6PK STRL BLUE (TOWEL DISPOSABLE) ×3 IMPLANT
TRAY FOLEY CATH SILVER 14FR (SET/KITS/TRAYS/PACK) ×3 IMPLANT

## 2015-12-12 NOTE — Anesthesia Procedure Notes (Signed)

## 2015-12-12 NOTE — Anesthesia Postprocedure Evaluation (Signed)
Anesthesia Post Note  Patient: Ellen Bates  Procedure(s) Performed: Procedure(s) (LRB): CESAREAN SECTION (N/A)  Patient location during evaluation: PACU Anesthesia Type: Spinal Level of consciousness: awake Pain management: satisfactory to patient Vital Signs Assessment: post-procedure vital signs reviewed and stable Respiratory status: spontaneous breathing Cardiovascular status: blood pressure returned to baseline Postop Assessment: no headache and spinal receding Anesthetic complications: no    Last Vitals:  Filed Vitals:   12/12/15 0845 12/12/15 0900  BP: 119/71 103/69  Pulse: 61 68  Temp:    Resp: 14 17    Last Pain:  Filed Vitals:   12/12/15 0902  PainSc: 0-No pain                 Chanice Brenton EDWARD

## 2015-12-12 NOTE — Addendum Note (Signed)
Addendum  created 12/12/15 1536 by Junious Silk, CRNA   Modules edited: Clinical Notes   Clinical Notes:  File: [id number]

## 2015-12-12 NOTE — H&P (Signed)
  H&P by [name], MD at 12/03/2015 9:04 AM    Author: [name], MD Service: Obstetrics/Gynecology Author Type: Physician   Filed: 12/03/2015 9:07 AM Note Time: 12/03/2015 9:04 AM Status: Signed   Editor: [name], MD (Physician)     Expand All Collapse All   Ellen Bates is a 30 y.o. female presenting for primary c/s for placenta previa. Has had hospitalization for bleeding and received BMZ series. Pregnancy otherwise uncomplicated. History OB History    Gravida Para Term Preterm AB TAB SAB Ectopic Multiple Living   1              Past Medical History  Diagnosis Date  . Medical history non-contributory    Past Surgical History  Procedure Laterality Date  . Wisdom tooth extraction     Family History: family history includes Hyperlipidemia in her mother. Social History:  reports that she has never smoked. She has never used smokeless tobacco. She reports that she does not drink alcohol or use illicit drugs.   Prenatal Transfer Tool  Maternal Diabetes: No Genetic Screening: Normal Maternal Ultrasounds/Referrals: Normal except posterior previa Fetal Ultrasounds or other Referrals: None Maternal Substance Abuse: No Significant Maternal Medications: None Significant Maternal Lab Results: None Other Comments: None  ROS    There were no vitals taken for this visit. Exam Physical Exam  Prenatal labs: ABO, Rh: --/--/B POS (01/04 2150) Antibody: NEG (01/04 2150) Rubella:   RPR:    HBsAg:   .HIV:    GBS:     Assessment/Plan: IUP at 36 1/2 weeks Complete placenta previa for primary C/S Risks and benefits of C/S were discussed. All questions were answered and informed consent was obtained. Plan to proceed with low segment transverse Cesarean Section.   [name] C 12/03/2015, 9:04 AM  12/12/15 0715 Pt began bleeding this am at 330 am.  Min clots now and GFM and normal FHT.  Plts 89K. This patient  has been seen and examined.   All of her questions were answered.  Labs and vital signs reviewed.  Informed consent has been obtained.  The History and Physical is current.  DL

## 2015-12-12 NOTE — Lactation Note (Signed)
This note was copied from the chart of Ellen Bates. Lactation Consultation Note  [redacted]w[redacted]d.  < 6 bls.7 hours old.  P1. 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 Noted tongue humping and disorganized suck.  Reviewed suck training w/ parents and suggest they speak to Peds MD about frenulum. 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 Mother has already pumped approx 6 ml of colostrum and spoon fed to baby. Attempted latching in both cradle and football hold.  Taught FOB how to assist. Baby sucked a few times and pushes off with her tongue.   Reviewed hand expression w/ mother and was able to hand express 8 ml.  Spoon and syringe fed infant teaching FOB how to syringe feed. Suggest to mother if baby is latching she should post pump approx 6 times a day and give volume back to baby. If baby is not latching suggest she pump at least every 3 hours.   Discussed DEBP part cleaning and milk storage. Encouraged parents to call if they need assistance w/ feeding. Reviewed late preterm feeding policy and encouraged parents to limited feeding time to 30 min.  Encouraged STS.  Patient Name: Ellen Bates Today's Date: 12/12/2015 Reason for consult: Late preterm infant   Maternal Data    Feeding Feeding Type: Breast Fed Length of feed:  (a few sucks)  LATCH Score/Interventions Latch: Repeated attempts needed to sustain latch, nipple held in mouth throughout feeding, stimulation needed to elicit sucking reflex. Intervention(s): Adjust position;Assist with latch;Breast massage  Audible Swallowing: None Intervention(s): Skin to skin;Hand expression  Type of Nipple: Everted at rest and after stimulation  Comfort (Breast/Nipple): Soft / non-tender     Hold (Positioning): Assistance needed to correctly position infant at breast and maintain latch.  LATCH Score: 6  Lactation Tools  Discussed/Used Pump Review: Setup, frequency, and cleaning;Milk Storage Initiated by:: BR, RN Date initiated:: 12/12/15   Consult Status Consult Status: Follow-up Date: 12/13/15 Follow-up type: In-patient    Dahlia Byes [facility] 12/12/2015, 3:26 PM

## 2015-12-12 NOTE — Op Note (Signed)
Cesarean Section Procedure Note  Pre-operative Diagnosis: IUP at 36.5 weeks, Placenta previa (complete), Vaginal bleeding, Thrombocytopenia  Post-operative Diagnosis: same  Surgeon: Turner Daniels   Assistants: none  Anesthesia: Spinal  Procedure:  Low Segment Transverse cesarean section  Procedure Details  The patient was seen in the Holding Room. The risks, benefits, complications, treatment options, and expected outcomes were discussed with the patient.  The patient concurred with the proposed plan, giving informed consent.  The site of surgery properly noted/marked.. A Time Out was held and the above information confirmed.  After induction of anesthesia, the patient was draped and prepped in the usual sterile manner. A Pfannenstiel incision was made and carried down through the subcutaneous tissue to the fascia. Fascial incision was made and extended transversely. The fascia was separated from the underlying rectus tissue superiorly and inferiorly. The peritoneum was identified and entered. Peritoneal incision was extended longitudinally. The utero-vesical peritoneal reflection was incised transversely and the bladder flap was bluntly freed from the lower uterine segment. A low transverse uterine incision was made. Delivered from vertex presentation was a baby with Apgar scores of 8 at one minute and 9 at five minutes. After the umbilical cord was clamped and cut cord blood was obtained for evaluation. The placenta was removed intact and appeared normal. The uterine outline, tubes and ovaries appeared normal. The uterine incision was closed with running locked sutures of 0 monocryl and imbricated with 0 monocryl. Hemostasis was observed. Lavage was carried out until clear. The peritoneum was then closed with 0 monocryl and rectus muscles plicated in the midline.  After hemostasis was assured, the fascia was then reapproximated with running sutures of 0 Vicryl. Irrigation was applied and after  adequate hemostasis was assured, the skin was reapproximated with subcutaneous sutures using 4-0 monocryl.  Instrument, sponge, and needle counts were correct prior the abdominal closure and at the conclusion of the case. The patient received 2 grams cefotetan preoperatively.  Findings: Viable female  Estimated Blood Loss:  700cc         Specimens: Placenta was sent to labor and delivery         Complications:  None

## 2015-12-12 NOTE — Anesthesia Preprocedure Evaluation (Signed)
Anesthesia Evaluation  Patient identified by MRN, date of birth, ID band Patient awake    Reviewed: Allergy & Precautions, H&P , Patient's Chart, lab work & pertinent test results  Airway Mallampati: II  TM Distance: >3 FB Neck ROM: full    Dental no notable dental hx.    Pulmonary    Pulmonary exam normal breath sounds clear to auscultation       Cardiovascular Exercise Tolerance: Good  Rhythm:regular Rate:Normal     Neuro/Psych    GI/Hepatic   Endo/Other    Renal/GU      Musculoskeletal   Abdominal   Peds  Hematology  (+) anemia ,   Anesthesia Other Findings Plt 89k  Reproductive/Obstetrics                             Anesthesia Physical Anesthesia Plan  ASA: II  Anesthesia Plan: Spinal   Post-op Pain Management:    Induction:   Airway Management Planned:   Additional Equipment:   Intra-op Plan:   Post-operative Plan:   Informed Consent: I have reviewed the patients History and Physical, chart, labs and discussed the procedure including the risks, benefits and alternatives for the proposed anesthesia with the patient or authorized representative who has indicated his/her understanding and acceptance.   Dental Advisory Given  Plan Discussed with: CRNA  Anesthesia Plan Comments: (Lab work confirmed with CRNA in room. Platelets okay. Discussed spinal anesthetic, and patient consents to the procedure:  included risk of possible headache,backache, failed block, allergic reaction, and nerve injury. This patient was asked if she had any questions or concerns before the procedure started. )        Anesthesia Quick Evaluation

## 2015-12-12 NOTE — Transfer of Care (Signed)
Immediate Anesthesia Transfer of Care Note  Patient: Ellen Bates  Procedure(s) Performed: Procedure(s) with comments: CESAREAN SECTION (N/A) -  edc 01/05/16   Patient Location: PACU  Anesthesia Type:Spinal  Level of Consciousness: awake, alert  and oriented  Airway & Oxygen Therapy: Patient Spontanous Breathing  Post-op Assessment: Report given to RN  Post vital signs: Reviewed  Last Vitals:  Filed Vitals:   12/12/15 0605  BP: 115/75  Pulse: 94  Temp: 36.8 C  Resp: 18    Complications: No apparent anesthesia complications

## 2015-12-12 NOTE — Anesthesia Postprocedure Evaluation (Signed)
Anesthesia Post Note  Patient: Ellen Bates  Procedure(s) Performed: Procedure(s) (LRB): CESAREAN SECTION (N/A)  Patient location during evaluation: Mother Baby Anesthesia Type: Spinal Level of consciousness: awake and alert and oriented Pain management: pain level controlled Vital Signs Assessment: post-procedure vital signs reviewed and stable Respiratory status: spontaneous breathing and respiratory function stable Cardiovascular status: blood pressure returned to baseline Postop Assessment: no headache, no backache, no signs of nausea or vomiting, patient able to bend at knees and adequate PO intake Anesthetic complications: no    Last Vitals:  Filed Vitals:   12/12/15 1220 12/12/15 1320  BP: 104/63 107/67  Pulse: 61 57  Temp: 36.4 C 36.5 C  Resp: 18 18    Last Pain:  Filed Vitals:   12/12/15 1439  PainSc: 3                  Rane Blitch

## 2015-12-13 LAB — CBC
HEMATOCRIT: 27.8 % — AB (ref 36.0–46.0)
HEMOGLOBIN: 9.5 g/dL — AB (ref 12.0–15.0)
MCH: 30.4 pg (ref 26.0–34.0)
MCHC: 34.2 g/dL (ref 30.0–36.0)
MCV: 89.1 fL (ref 78.0–100.0)
Platelets: 91 10*3/uL — ABNORMAL LOW (ref 150–400)
RBC: 3.12 MIL/uL — AB (ref 3.87–5.11)
RDW: 13.7 % (ref 11.5–15.5)
WBC: 11.1 10*3/uL — ABNORMAL HIGH (ref 4.0–10.5)

## 2015-12-13 MED ORDER — IBUPROFEN 600 MG PO TABS
600.0000 mg | ORAL_TABLET | Freq: Four times a day (QID) | ORAL | Status: DC | PRN
  Administered 2015-12-13 – 2015-12-15 (×5): 600 mg via ORAL
  Filled 2015-12-13 (×6): qty 1

## 2015-12-13 NOTE — Progress Notes (Signed)
Subjective: Postpartum Day 1: Cesarean Delivery Patient reports incisional pain, tolerating PO and no problems voiding.    Objective: Vital signs in last 24 hours: Temp:  [97.5 F (36.4 C)-98.2 F (36.8 C)] 97.9 F (36.6 C) (01/21 0908) Pulse Rate:  [52-71] 64 (01/21 0908) Resp:  [15-18] 16 (01/21 0908) BP: (94-112)/(53-67) 105/58 mmHg (01/21 0908) SpO2:  [96 %-100 %] 97 % (01/21 0908) Weight:  [63.504 kg (140 lb)] 63.504 kg (140 lb) (01/20 1000)  Physical Exam:  General: alert, cooperative and appears stated age 30: appropriate Uterine Fundus: firm Incision: healing well, no significant drainage, no dehiscence DVT Evaluation: No evidence of DVT seen on physical exam. Negative Homan's sign. No cords or calf tenderness.   Recent Labs  12/12/15 0605 12/13/15 0550  HGB 11.5* 9.5*  HCT 32.8* 27.8*    Assessment/Plan: Status post Cesarean section. Doing well postoperatively.  Continue current care.  Dawsen Krieger 12/13/2015, 9:35 AM

## 2015-12-13 NOTE — Lactation Note (Addendum)
This note was copied from the chart of Ellen Bates. Lactation Consultation Note  Patient Name: Ellen Bates [id number] Date: 12/13/2015 Reason for consult: Follow-up assessment   With this mom of a LPI, now 36 5./7 weeks CGA, and weight 5 lbs 7.1 oz. Mom has been trying to breast feed and syringe feed at the breast with a nipple shield. Ellen Bates was too tired to suckle . I suggested that until her frenulum was looked at tomorow, that parents bottle feed her, at ;east every 3 hours or with cues, up to 20 ml's total offered today, and increase tomorrow, as per LPI handout.  and mom pump  every 3 hoursnfollowed by hand expression. They agreed, and Ellen Bates took 1 ml colostrum and 17 ml's of alimentum formula, and tolerated this well. The baby was clicking her tongue even with the bottle, but was able to feed well  this way . She also has sucking blisters on her lips ( cobblestone look).  Parents very receptive to teaching. 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 I decrease mom to 21 flanges with what I think will be a better fit. Mom knows to go back to 24 if 21 too tight.  They know to call for questions/concerns.   's   Maternal Data    Feeding Feeding Type: Bottle Fed - Breast Milk Nipple Type: Slow - flow Length of feed: 20 min  LATCH Score/Interventions Latch: Repeated attempts needed to sustain latch, nipple held in mouth throughout feeding, stimulation needed to elicit sucking reflex.  Audible Swallowing: A few with stimulation (Formula; occasional swallow)  Type of Nipple: Everted at rest and after stimulation Intervention(s): Double electric pump  Comfort (Breast/Nipple): Filling, red/small blisters or bruises, mild/mod discomfort     Hold (Positioning): Assistance needed to correctly position infant at breast and maintain latch.  LATCH Score: 6  Lactation Tools Discussed/Used Tools: Nipple Shields Nipple shield size: 16 Pump Review: Setup, frequency, and  cleaning   Consult Status Consult Status: Follow-up Date: 12/14/15 Follow-up type: In-patient    Alfred Levins 12/13/2015, 2:37 PM

## 2015-12-14 LAB — CBC
HCT: 26.3 % — ABNORMAL LOW (ref 36.0–46.0)
Hemoglobin: 9 g/dL — ABNORMAL LOW (ref 12.0–15.0)
MCH: 30.6 pg (ref 26.0–34.0)
MCHC: 34.2 g/dL (ref 30.0–36.0)
MCV: 89.5 fL (ref 78.0–100.0)
PLATELETS: 107 10*3/uL — AB (ref 150–400)
RBC: 2.94 MIL/uL — ABNORMAL LOW (ref 3.87–5.11)
RDW: 14 % (ref 11.5–15.5)
WBC: 11.7 10*3/uL — AB (ref 4.0–10.5)

## 2015-12-14 LAB — TYPE AND SCREEN
ABO/RH(D): B POS
Antibody Screen: NEGATIVE
UNIT DIVISION: 0
Unit division: 0

## 2015-12-14 MED ORDER — FERROUS SULFATE 325 (65 FE) MG PO TABS
325.0000 mg | ORAL_TABLET | Freq: Two times a day (BID) | ORAL | Status: DC
  Administered 2015-12-14 – 2015-12-15 (×2): 325 mg via ORAL
  Filled 2015-12-14 (×2): qty 1

## 2015-12-14 NOTE — Lactation Note (Signed)
This note was copied from the chart of Ellen Bates. Lactation Consultation Note Follow up visit at 59 hours of age.  Baby had frenotomy today with one feeding since.  Baby is held by dad and fussy, mom is pumping when LC entered room.  FOB changed dirty diaper and LC assisted with set up of SNS with of moms EBM.  LC finger fed with syringe prior to latch to calm baby.  Baby can extend tongue, but continues to tongue thrust/hump some.  High palate noted with gloved finger.  Baby latched well to right breast in cross cradle hold.  Baby tolerated of colostrum well with LC priming tubing as needed.  Baby stopped to burp and then back to breast for additional supplement of formula.  Baby did not tolerate well and continues to suck but not swallowing formula well.  Feeding stopped at that point.  Baby spit up small amount of thick colostrum with burping.  Encouraged mom to post pump to have EBM to offer baby at next feeding.  Mom does report some increased pain with latch, but possible due to nipple trauma.  LC offered to use NS if needed and mom declined wanting to try without. LC advised that if latching is more painful to use NS as a barrier to protect nipples.  Mom to use SNS  And supplement  at all feedings.  Parents to follow plan overnight and reevaluate in the morning as parents are anticipating discharge.    Patient Name: Ellen Bates [id number] Date: 12/14/2015 Reason for consult: Follow-up assessment;Breast/nipple pain;Difficult latch;Infant < 6lbs;Late preterm infant   Maternal Data Has patient been taught Hand Expression?: Yes Does the patient have breastfeeding experience prior to this delivery?: No  Feeding Feeding Type: Breast Fed Length of feed: 20 min  LATCH Score/Interventions Latch: Repeated attempts needed to sustain latch, nipple held in mouth throughout feeding, stimulation needed to elicit sucking reflex. Intervention(s): Adjust position;Assist with  latch;Breast massage;Breast compression  Audible Swallowing: Spontaneous and intermittent (with SNS)  Type of Nipple: Everted at rest and after stimulation  Comfort (Breast/Nipple): Filling, red/small blisters or bruises, mild/mod discomfort  Problem noted: Mild/Moderate discomfort Interventions (Mild/moderate discomfort): Post-pump;Pre-pump if needed;Hand expression;Breast shields  Hold (Positioning): Assistance needed to correctly position infant at breast and maintain latch. Intervention(s): Breastfeeding basics reviewed;Support Pillows;Position options;Skin to skin  LATCH Score: 7  Lactation Tools Discussed/Used     Consult Status Consult Status: Follow-up Date: 12/15/15 Follow-up type: In-patient    Ellen Bates, Arvella Merles 12/14/2015, 7:05 PM

## 2015-12-14 NOTE — Lactation Note (Signed)
This note was copied from the chart of Ellen Doylene Canard. Lactation Consultation Note Follow up visit at 61 hours of age.  FOB requests assist with SNS not working well. LC and RN at bedside to help with priming SNS and helping baby latch.  Baby does well with colostrum and then became fussy for formula supplement.  After several attempts baby did well with SNS and formula supplementation with good latch swallows audible (sns).  Mom continues to report  Minimal pain and declines desire to use NS if baby is latching well with SNS.    Patient Name: Ellen Bates NFAOZ'H Date: 12/14/2015 Reason for consult: Follow-up assessment   Maternal Data Has patient been taught Hand Expression?: Yes Does the patient have breastfeeding experience prior to this delivery?: No  Feeding Feeding Type: Breast Fed Length of feed:  (8 minutes observed)  LATCH Score/Interventions Latch: Grasps breast easily, tongue down, lips flanged, rhythmical sucking. Intervention(s): Adjust position;Assist with latch;Breast massage;Breast compression  Audible Swallowing: Spontaneous and intermittent (with SNS) Intervention(s): Skin to skin;Hand expression  Type of Nipple: Everted at rest and after stimulation  Comfort (Breast/Nipple): Filling, red/small blisters or bruises, mild/mod discomfort  Problem noted: Mild/Moderate discomfort Interventions (Mild/moderate discomfort): Post-pump;Pre-pump if needed;Hand expression;Breast shields  Hold (Positioning): Assistance needed to correctly position infant at breast and maintain latch. Intervention(s): Breastfeeding basics reviewed;Support Pillows;Position options;Skin to skin  LATCH Score: 8  Lactation Tools Discussed/Used Tools: Supplemental Nutrition System   Consult Status Consult Status: Follow-up Date: 12/15/15 Follow-up type: In-patient    Ellen Bates 12/14/2015, 9:48 PM

## 2015-12-14 NOTE — Progress Notes (Signed)
Subjective: Postpartum Day 2: Cesarean Delivery Patient reports tolerating PO, + flatus and no problems voiding.    Objective: Vital signs in last 24 hours: Temp:  [97.9 F (36.6 C)-98.1 F (36.7 C)] 97.9 F (36.6 C) (01/22 0655) Pulse Rate:  [75-83] 83 (01/22 0655) Resp:  [16-18] 18 (01/22 0655) BP: (104-110)/(58-65) 110/65 mmHg (01/22 0655) SpO2:  [96 %] 96 % (01/21 1737)  Physical Exam:  General: alert, cooperative and appears stated age Lochia: appropriate Uterine Fundus: firm Incision: healing well, no significant drainage, no dehiscence DVT Evaluation: No evidence of DVT seen on physical exam. Negative Homan's sign. No cords or calf tenderness.   Recent Labs  12/13/15 0550 12/14/15 0530  HGB 9.5* 9.0*  HCT 27.8* 26.3*    Assessment/Plan: Status post Cesarean section. Doing well postoperatively.  Continue current care.  Pearlene Teat 12/14/2015, 10:20 AM

## 2015-12-15 ENCOUNTER — Encounter (HOSPITAL_COMMUNITY): Payer: Self-pay | Admitting: Obstetrics and Gynecology

## 2015-12-15 NOTE — Discharge Summary (Signed)
Obstetric Discharge Summary Reason for Admission: cesarean section Prenatal Procedures: ultrasound Intrapartum Procedures: cesarean: low cervical, transverse Postpartum Procedures: none Complications-Operative and Postpartum: none HEMOGLOBIN  Date Value Ref Range Status  12/14/2015 9.0* 12.0 - 15.0 g/dL Final   HCT  Date Value Ref Range Status  12/14/2015 26.3* 36.0 - 46.0 % Final    Physical Exam:  General: alert and cooperative Lochia: appropriate Uterine Fundus: firm Incision: healing well DVT Evaluation: No evidence of DVT seen on physical exam. Negative Homan's sign. No cords or calf tenderness. No significant calf/ankle edema.  Discharge Diagnoses: Term Pregnancy-delivered  Discharge Information: Date: 12/15/2015 Activity: pelvic rest Diet: routine Medications: PNV and Ibuprofen Condition: stable Instructions: refer to practice specific booklet Discharge to: home   Newborn Data: Live born female  Birth Weight: 5 lb 10.1 oz (2555 g) APGAR: 8, 9  Home with mother.  Ellen Bates G 12/15/2015, 8:54 AM

## 2015-12-15 NOTE — Lactation Note (Addendum)
This note was copied from the chart of Ellen LaurDoylene Canard Lactation Consultation Note  Patient Name: Ellen Bates RUEAV'W Date: 12/15/2015 Reason for consult: Follow-up assessment;Late preterm infant;Infant < 6lbs;Hyperbilirubinemia  Baby is 44 hours old , baby and mom for discharge today. Weight 5-5.4 oz , 5% weight loss.  Mom has been doing the extra pumping and milk is in , last pump at 630 pm with 25 ml EBM yield. @ consult baby awake and rooting. Dad had set up the SNS with 25 ml of EBM in a starter SNS. Mom positioned  The baby in cross  - cradle with firm support. LC reviewed steps for latching, and assisted mom with depth. Baby fed  For 20 mins and took the 25 ml of EBM well. Latch score 9. Nipple well rounded when baby released. Voided large amount after the feeding.  Nipples  pinky red , and per mom tender , but ok to latch . Mom already has comfort gels , and LC provided shells. Sore nipple and engorgement prevention and  tx reviewed. Per mom has a DEBP at home.  LC recommended breast feeding with SNS ( EBM if available , formula if not ) on the 1st breast. And post pump after 4-5 feedings and when necessary  10-20 mins , save milk and supplement back to baby.  Mother informed of post-discharge support and given phone number to the lactation department, including services for phone call assistance; out-patient appointments; and breastfeeding support group. List of other breastfeeding resources in the community given in the handout. Encouraged mother to call for problems or concerns related to breastfeeding. LC F/U appt. For Monday January 1/30 at 9am , appt. Reminder given to mom. Both mom and dad receptive to F/U appt..  2 extra Starter SNS's given . LC discussed with mom and dad since moms milk is in probably after 48 hours SNS won't be needed. Baby is only at 5% weight loss.  Repeat Bili check at Dr. Isidore Moos per parents.  S/P Frenotomy 1/22 . Baby is transferring milk well.       Maternal Data Has patient been taught Hand Expression?: Yes  Feeding Feeding Type: Breast Fed Length of feed: 20 min  LATCH Score/Interventions Latch: Grasps breast easily, tongue down, lips flanged, rhythmical sucking. Intervention(s): Adjust position;Assist with latch;Breast massage;Breast compression  Audible Swallowing: Spontaneous and intermittent  Type of Nipple: Everted at rest and after stimulation  Comfort (Breast/Nipple): Soft / non-tender     Hold (Positioning): Assistance needed to correctly position infant at breast and maintain latch. Intervention(s): Breastfeeding basics reviewed  LATCH Score: 9  Lactation Tools Discussed/Used Tools: Pump;Supplemental Nutrition System Breast pump type: Double-Electric Breast Pump WIC Program: No   Consult Status Consult Status: Follow-up Date: 12/22/15 (9am WH , LC O/P appt. ) Follow-up type: Out-patient    Kathrin Greathouse 12/15/2015, 10:41 AM

## 2015-12-22 ENCOUNTER — Ambulatory Visit (HOSPITAL_COMMUNITY)
Admit: 2015-12-22 | Discharge: 2015-12-22 | Disposition: A | Discharge status: Home / Self Care | Payer: 59 | Attending: Obstetrics and Gynecology | Admitting: Obstetrics and Gynecology

## 2015-12-22 NOTE — Lactation Note (Signed)
Lactation Consult  Mother's reason for visit: Follow up form the hospital - latching challenges / slower weight gain  Visit Type:  Feeding assessment  Appointment Notes:  > 6 pounds, 36 4/7 , 5% weight loss, P1, D/C with SNS ( several starter SNS's ) mom needs reassurance , confirmed 1/30  Consult:  Initial Lactation Consultant:  Ellen Bates  ________________________________________________________________________ Baby's Name: Ellen Bates Date of Birth: 12/12/2016 Pediatrician: Dr. Loyola Mast  Gender: female Gestational Age: [redacted]w[redacted]d (At Birth) Birth Weight: 5 lb 10.1 oz (2555 g) Weight at Discharge: Weight: 5 lb 5.4 oz (2420 g)Date of Discharge: 12/15/2015 Filed Weights   12/13/15 0030 12/14/15 0200 12/14/15 2300  Weight: 5 lb 7.1 oz (2470 g) 5 lb 5.4 oz (2421 g) 5 lb 5.4 oz (2420 g)   Last weight taken from location outside of [facility]:5-4 oz 1/26  Location:Pediatrician's office Weight today: 5-9.3 oz , 2532 g    ___________________________________________________________________  Mother's Name: Ellen Bates Type of delivery:  C/section  Breastfeeding Experience:  1st baby  Maternal Medical Conditions:  Hx anemia, but not with this pregnancy, otherwise no risk  Maternal Medications: PNV   ________________________________________________________________________  Breastfeeding History (Post Discharge) - in the hospital baby had a anterior Frenotomy   Frequency of breastfeeding: per mom 8x's day  Duration of feeding:  Average 30 mins   Supplementing: initially was using the SNS at home until the 1st weight check and then stopped per Dr. ( per mom )   Pumping: per mom no, but I have a DEBP   Infant Intake and Output Assessment  Voids: about 10 in 24 hours Clear yellow Stools:  7-9 24 hrs.  Color:  Yellow  ________________________________________________________________________  Maternal Breast Assessment  Breast:   Full Nipple:  Erect Pain level:  0 Pain interventions:  Expressed breast milk  _______________________________________________________________________ Feeding Assessment/Evaluation  Initial feeding assessment:  Infant's oral assessment:  Variance ( LC oral exam with a gloved finger , noted a high palate , short labial frenulum  down to the gum line, ( upper lip stretches well with exam and at the breast when flipped to flanged position)  Good tongue mobility noted and baby able to stretch tongue over gum line and upward. No humping of the tongue noted with strong suck.  Mom was inquiring about the labial frenulum. Mom voiced concerns over the labial frenulum and the blister in the middle of her lip .   Per mom Baby last fed at 0800 for 7 mins , ( REPORTS LOTS OF SWALLOWS )   Positioning:  Football - LC had mom latch the baby without the NS , to see how much she could transfer.  Left breast  LATCH documentation:  Latch:  2 = Grasps breast easily, tongue down, lips flanged, rhythmical sucking.  Audible swallowing:  2 = Spontaneous and intermittent  Type of nipple:  2 = Everted at rest and after stimulation  Comfort (Breast/Nipple):  1 = Filling, red/small blisters or bruises, mild/mod discomfort  Hold (Positioning):  1 = Assistance needed to correctly position infant at breast and maintain latch  LATCH score:  8   Attached assessment:  Deep  Lips flanged:  No, LC assisted mom to flip outward   Lips untucked:  Yes.    Suck assessment:  Nutritive  Tools:  Trail without the NS for latch  Instructed on use and cleaning of tool:  Yes.    Per mom had been using the # 16 NS  at home and was wondering it was preventing her sore nipples from  Healing completely.  LC re-sized NS , and noted the #16 to be to small , and the #20 NS to fit just right and to accommodate the areola more for enhanced stimulation.   Pre-feed weight:  2532 g , 5-9.3 oz  Post-feed weight:  2562 g , 5-10.4 oz   Amount transferred: 30 ml  Amount supplemented: none   Additional Feeding Assessment -   Infant's oral assessment:  Variance ( SEE ABOVE NOTE )   Positioning:  Cross cradle Right breast  LATCH documentation:  Latch:  2 = Grasps breast easily, tongue down, lips flanged, rhythmical sucking.  Audible swallowing:  2 = Spontaneous and intermittent  Type of nipple:  2 = Everted at rest and after stimulation  Comfort (Breast/Nipple):  1 = Filling, red/small blisters or bruises, mild/mod discomfort  Hold (Positioning):  1 = Assistance needed to correctly position infant at breast and maintain latch  LATCH score:  8   Attached assessment:  Shallow  Lips flanged:  Yes.    Lips untucked:  Yes.    Suck assessment:  Nutritive  Tools:  Nipple shield 20 mm Instructed on use and cleaning of tool:  Yes.    Pre-feed weight:  2562 g , 5-10.4 oz  Post-feed weight:  2576 g  Amount transferred:  14 ml  Amount supplemented:  None    Total amount pumped post feed: mom did not post pump   Total amount transferred:  44 ml ( per mom baby ate an hour before feeding for 7 mins with lots of swallows)  Total supplement given:  None   Lactation Impression:  Oral Variance noted ( see above note ) , doesn't interfere with latching.  LC suspects the frenotomy was beneficial to milk transfer.  Baby does have a high palate so positioning needed to be reviewed.  Baby transferred more milk without the Nipple shield at latch compared to with use of the #20 NS.  Mom reports the baby has times of hanging out and requires a lot of stimulation.  LC reminded mom intermittent stimulation, skin to skin feedings, and even releasing baby from the latch if needed.   Lactation Plan of care: Per mom Smart start appt. For 2/1 .  LC recommended since she was weighed today to try to move smart start appt. To Thursday 2/2. And then F/U with LC 2/9 at 230 pm  Praised mom for her breast feeding efforts IF using the NS (  #20 )  LC mentioned to mom in the next week to feed the 1st breast with the NS , and 2nd breast try without.  If Raynelle Fanning is sluggish with her feeding  Watch for hanging out , non - nutritive sucking patterns  Steps for latching ( breast massage , hand express off colostrum collector 11 ml if really full ) - pan on offering the 2nd breast at the same feeding  When latching - tickle upper lip , wait for wide open mouth, check lip line, lips flanged, ease down chin.  Breast compressions with latch and intermittently to enhance flow top baby. Extra pumping - assess breast after feeding = if still full post pump 10 mins both together 3-4 x's a day , especially is using the nipple shield ( due to it being a barrier and not skin to skin. )  Extra pumping  And [place] of Nipple shield will be reevaluated at next weeks  LC appt. On 2/9 .

## 2016-01-01 ENCOUNTER — Ambulatory Visit (HOSPITAL_COMMUNITY)
Admission: RE | Admit: 2016-01-01 | Discharge: 2016-01-01 | Disposition: A | Discharge status: Home / Self Care | Payer: 59 | Source: Ambulatory Visit | Attending: Obstetrics and Gynecology | Admitting: Obstetrics and Gynecology

## 2016-01-01 NOTE — Lactation Note (Signed)
Lactation Consult  Mother's reason for visit : Mother states that nipples are still slightly sore. Mother states that she plans to see Dr Rana Snare and discuss tongue revision.  Visit Type: feeding assessment Appointment Notes:Mother is using a nipple shield 1-2 times daily . Mother has slight pink nipples. Advised mother to use the off sided latch technique. Consult:  Follow-Up Lactation Consultant:  Michel Bickers  ________________________________________________________________________    ________________________________________________________________________  Mother's Name: Ellen Bates Type of delivery:  vaginal del  Breastfeeding Experience:  none Maternal Medical Conditions:  none Maternal Medications:  Prenatal vits, iron once daily  ________________________________________________________________________  Breastfeeding History (Post Discharge)  Frequency of breastfeeding:every 2-3 hours  Duration of feeding: 30-40 mins     Pumping  Type of pump:  Medela pump in style Frequency:  Once daily Volume:  2-3 ounces  Infant Intake and Output Assessment  Voids:  8  24 hrs.  Color:  Clear yellow Stools:  4-5 24 hrs.  Color:  Yellow  ________________________________________________________________________  Maternal Breast Assessment  Breast:  Full Nipple:  Erect Pain level:  2 on the initial latch Pain interventions:  Bra    Positioning:  Cradle Right breast  LATCH documentation:  Latch:  2 = Grasps breast easily, tongue down, lips flanged, rhythmical sucking.  Audible swallowing:  2 = Spontaneous and intermittent  Type of nipple:  2 = Everted at rest and after stimulation  Comfort (Breast/Nipple):  1 = Filling, red/small blisters or bruises, mild/mod discomfort  Hold (Positioning):  1 = Assistance needed to correctly position infant at breast and maintain latch  LATCH score:  8  Attached assessment:  Deep  Lips flanged:  Yes.    Lips untucked:   Yes.    Suck assessment:  Displays both   Pre-feed weight: 2950,6-8.1 Post-feed weight 3000,6-9.9 Amount transferred:  50 ml Amount supplemented:    Additional Feeding Assessment - Mother placed infant in cross cradle hold and allowed  to self latch. Assist mother with off sided latch technique. Infant latched with mother describing #2 pain scale on the initial latch. Assist mother with adjusted infants lower jaw for wider gape and rolling upper lip . Infant sustained latch for 20 mins on first breast and another 10 mins on alternate breast. Observed slight pinching of mothers nipples when infant released the breast.   Infant's oral assessment:  Variance, infant was observed with suck blisters on the entire upper and low lips. , infant has a tight  labia lip tie and a posterior lingual tongue tie. Infant cups and rolls edges of tongue upward. Infant has lateral movement.   Positioning:  Football Left breast  LATCH documentation:  Latch:  2 = Grasps breast easily, tongue down, lips flanged, rhythmical sucking.  Audible swallowing:  2 = Spontaneous and intermittent  Type of nipple:  2 = Everted at rest and after stimulation  Comfort (Breast/Nipple):  1 = Filling, red/small blisters or bruises, mild/mod discomfort  Hold (Positioning):  1 = Assistance needed to correctly position infant at breast and maintain latch  LATCH score:  8  Attached assessment:  Deep  Lips flanged:  Yes.    Lips untucked:  Yes.    Suck assessment:  Nutritive and Nonnutritive   Pre-feed weight: 3000  Post-feed weight:3026   Amount transferred: 26 ml  Total amount transferred: 76 ml  Mother advised to continue to breast feed infant on cue and at least 8-12 times in 24 hours Advised in tips for proper latch and  the use of breast compression Mother to post pump once daily for 15-20 mins Mother was given Dr Narda Bonds contact information on a piror visit. Mother states she plans to discuss options with Dr Rana Snare   Follow up with [facility] post revision for feeding assessment.

## 2016-05-24 ENCOUNTER — Telehealth (HOSPITAL_COMMUNITY): Payer: Self-pay | Admitting: Lactation Services

## 2016-05-24 NOTE — Telephone Encounter (Signed)
Mother had questions about pumping and whether she should rental one of our hospital grade pumps to improve her milk supply.  Discussed power pumping, fenugreek and recommend pumping at least 8 times a day.

## 2017-09-15 LAB — OB RESULTS CONSOLE GC/CHLAMYDIA
CHLAMYDIA, DNA PROBE: NEGATIVE
GC PROBE AMP, GENITAL: NEGATIVE

## 2017-09-15 LAB — OB RESULTS CONSOLE HIV ANTIBODY (ROUTINE TESTING): HIV: NONREACTIVE

## 2017-09-15 LAB — OB RESULTS CONSOLE RUBELLA ANTIBODY, IGM: RUBELLA: IMMUNE

## 2017-09-15 LAB — OB RESULTS CONSOLE ANTIBODY SCREEN: ANTIBODY SCREEN: NEGATIVE

## 2017-09-15 LAB — OB RESULTS CONSOLE HEPATITIS B SURFACE ANTIGEN: Hepatitis B Surface Ag: NEGATIVE

## 2017-09-15 LAB — OB RESULTS CONSOLE RPR: RPR: NONREACTIVE

## 2017-09-15 LAB — OB RESULTS CONSOLE ABO/RH: RH Type: POSITIVE

## 2018-03-23 ENCOUNTER — Encounter (HOSPITAL_COMMUNITY): Payer: Self-pay | Admitting: *Deleted

## 2018-04-05 NOTE — Patient Instructions (Signed)
Ellen Bates  04/05/2018   Your procedure is scheduled on:  04/07/2018  Enter through the Main Entrance of [facility] at 0530 AM.  Pick up the phone at the desk and dial [phone]  Call this number if you have problems the morning of surgery:[phone]  Remember:   Do not eat food:(After Midnight) Desps de medianoche.  Do not drink clear liquids: (After Midnight) Desps de medianoche.  Take these medicines the morning of surgery with A SIP OF WATER: none   Do not wear jewelry, make-up or nail polish.  Do not wear lotions, powders, or perfumes. Do not wear deodorant.  Do not shave 48 hours prior to surgery.  Do not bring valuables to the hospital.  [facility] is not   responsible for any belongings or valuables brought to the hospital.  Contacts, dentures or bridgework may not be worn into surgery.  Leave suitcase in the car. After surgery it may be brought to your room.  For patients admitted to the hospital, checkout time is 11:00 AM the day of              discharge.    N/A   Please read over the following fact sheets that you were given:   Surgical Site Infection Prevention

## 2018-04-06 ENCOUNTER — Encounter (HOSPITAL_COMMUNITY)
Admission: RE | Admit: 2018-04-06 | Discharge: 2018-04-06 | Disposition: A | Discharge status: Home / Self Care | Payer: BLUE CROSS/BLUE SHIELD | Source: Ambulatory Visit | Attending: Obstetrics and Gynecology | Admitting: Obstetrics and Gynecology

## 2018-04-06 HISTORY — DX: Disorder of pituitary gland, unspecified: E23.7

## 2018-04-06 LAB — CBC
HEMATOCRIT: 35 % — AB (ref 36.0–46.0)
Hemoglobin: 12.1 g/dL (ref 12.0–15.0)
MCH: 31.3 pg (ref 26.0–34.0)
MCHC: 34.6 g/dL (ref 30.0–36.0)
MCV: 90.4 fL (ref 78.0–100.0)
PLATELETS: 101 10*3/uL — AB (ref 150–400)
RBC: 3.87 MIL/uL (ref 3.87–5.11)
RDW: 13.1 % (ref 11.5–15.5)
WBC: 8.1 10*3/uL (ref 4.0–10.5)

## 2018-04-06 LAB — TYPE AND SCREEN
ABO/RH(D): B POS
ANTIBODY SCREEN: NEGATIVE

## 2018-04-06 NOTE — Pre-Procedure Instructions (Signed)
Dr Hart Rochester notified of platelet count. No new orders received.

## 2018-04-07 ENCOUNTER — Inpatient Hospital Stay (HOSPITAL_COMMUNITY): Payer: BLUE CROSS/BLUE SHIELD | Admitting: Anesthesiology

## 2018-04-07 ENCOUNTER — Encounter (HOSPITAL_COMMUNITY): Payer: Self-pay

## 2018-04-07 ENCOUNTER — Encounter (HOSPITAL_COMMUNITY)
Admission: RE | Disposition: A | Discharge status: Home / Self Care | Payer: Self-pay | Source: Ambulatory Visit | Attending: Obstetrics and Gynecology

## 2018-04-07 ENCOUNTER — Other Ambulatory Visit: Payer: Self-pay

## 2018-04-07 ENCOUNTER — Inpatient Hospital Stay (HOSPITAL_COMMUNITY)
Admission: RE | Admit: 2018-04-07 | Discharge: 2018-04-09 | DRG: 788 | Disposition: A | Discharge status: Home / Self Care | Payer: BLUE CROSS/BLUE SHIELD | Source: Ambulatory Visit | Attending: Obstetrics and Gynecology | Admitting: Obstetrics and Gynecology

## 2018-04-07 DIAGNOSIS — O34211 Maternal care for low transverse scar from previous cesarean delivery: Secondary | ICD-10-CM | POA: Diagnosis present

## 2018-04-07 DIAGNOSIS — Z3A39 39 weeks gestation of pregnancy: Secondary | ICD-10-CM

## 2018-04-07 LAB — CBC
HCT: 35.6 % — ABNORMAL LOW (ref 36.0–46.0)
HCT: 32.7 % — ABNORMAL LOW (ref 36.0–46.0)
HEMOGLOBIN: 12.2 g/dL (ref 12.0–15.0)
Hemoglobin: 11.2 g/dL — ABNORMAL LOW (ref 12.0–15.0)
MCH: 31.2 pg (ref 26.0–34.0)
MCH: 31.3 pg (ref 26.0–34.0)
MCHC: 34.3 g/dL (ref 30.0–36.0)
MCHC: 34.3 g/dL (ref 30.0–36.0)
MCV: 91 fL (ref 78.0–100.0)
MCV: 91.3 fL (ref 78.0–100.0)
PLATELETS: 86 10*3/uL — AB (ref 150–400)
Platelets: 98 10*3/uL — ABNORMAL LOW (ref 150–400)
RBC: 3.91 MIL/uL (ref 3.87–5.11)
RBC: 3.58 MIL/uL — ABNORMAL LOW (ref 3.87–5.11)
RDW: 13 % (ref 11.5–15.5)
RDW: 12.8 % (ref 11.5–15.5)
WBC: 7.4 10*3/uL (ref 4.0–10.5)
WBC: 12.9 10*3/uL — ABNORMAL HIGH (ref 4.0–10.5)

## 2018-04-07 LAB — RPR: RPR: NONREACTIVE

## 2018-04-07 SURGERY — Surgical Case
Anesthesia: Spinal

## 2018-04-07 MED ORDER — LACTATED RINGERS IV SOLN
INTRAVENOUS | Status: DC
  Administered 2018-04-07: 125 mL/h via INTRAVENOUS

## 2018-04-07 MED ORDER — ONDANSETRON HCL 4 MG/2ML IJ SOLN: 4.0000 mg | Freq: Three times a day (TID) | INTRAMUSCULAR | Status: DC | PRN

## 2018-04-07 MED ORDER — IBUPROFEN 600 MG PO TABS: 600.0000 mg | ORAL_TABLET | Freq: Four times a day (QID) | ORAL | Status: DC

## 2018-04-07 MED ORDER — FENTANYL CITRATE (PF) 100 MCG/2ML IJ SOLN
INTRAMUSCULAR | Status: AC
  Filled 2018-04-07: qty 2

## 2018-04-07 MED ORDER — NALBUPHINE HCL 10 MG/ML IJ SOLN: 5.0000 mg | INTRAMUSCULAR | Status: DC | PRN

## 2018-04-07 MED ORDER — NALOXONE HCL 4 MG/10ML IJ SOLN: 1.0000 ug/kg/h | INTRAVENOUS | Status: DC | PRN

## 2018-04-07 MED ORDER — DEXAMETHASONE SODIUM PHOSPHATE 10 MG/ML IJ SOLN
INTRAMUSCULAR | Status: AC
  Filled 2018-04-07: qty 1

## 2018-04-07 MED ORDER — KETOROLAC TROMETHAMINE 30 MG/ML IJ SOLN: 30.0000 mg | Freq: Four times a day (QID) | INTRAMUSCULAR | Status: DC | PRN

## 2018-04-07 MED ORDER — ONDANSETRON HCL 4 MG/2ML IJ SOLN: 4.0000 mg | Freq: Four times a day (QID) | INTRAMUSCULAR | Status: DC | PRN

## 2018-04-07 MED ORDER — GLYCOPYRROLATE 0.2 MG/ML IJ SOLN
INTRAMUSCULAR | Status: AC
  Filled 2018-04-07: qty 1

## 2018-04-07 MED ORDER — SIMETHICONE 80 MG PO CHEW
80.0000 mg | CHEWABLE_TABLET | ORAL | Status: DC
  Administered 2018-04-08 (×2): 80 mg via ORAL
  Filled 2018-04-07 (×2): qty 1

## 2018-04-07 MED ORDER — DIPHENHYDRAMINE HCL 50 MG/ML IJ SOLN: 12.5000 mg | INTRAMUSCULAR | Status: DC | PRN

## 2018-04-07 MED ORDER — COCONUT OIL OIL: 1.0000 "application " | TOPICAL_OIL | Status: DC | PRN

## 2018-04-07 MED ORDER — MEPERIDINE HCL 25 MG/ML IJ SOLN: 6.2500 mg | INTRAMUSCULAR | Status: DC | PRN

## 2018-04-07 MED ORDER — LACTATED RINGERS IV SOLN
INTRAVENOUS | Status: DC | PRN
  Administered 2018-04-07 (×2): via INTRAVENOUS

## 2018-04-07 MED ORDER — SCOPOLAMINE 1 MG/3DAYS TD PT72
MEDICATED_PATCH | TRANSDERMAL | Status: AC
  Filled 2018-04-07: qty 1

## 2018-04-07 MED ORDER — TETANUS-DIPHTH-ACELL PERTUSSIS 5-2.5-18.5 LF-MCG/0.5 IM SUSP: 0.5000 mL | Freq: Once | INTRAMUSCULAR | Status: DC

## 2018-04-07 MED ORDER — SENNOSIDES-DOCUSATE SODIUM 8.6-50 MG PO TABS
2.0000 | ORAL_TABLET | ORAL | Status: DC
  Administered 2018-04-08 (×2): 2 via ORAL
  Filled 2018-04-07 (×2): qty 2

## 2018-04-07 MED ORDER — SCOPOLAMINE 1 MG/3DAYS TD PT72
1.0000 | MEDICATED_PATCH | Freq: Once | TRANSDERMAL | Status: DC
  Administered 2018-04-07: 1.5 mg via TRANSDERMAL

## 2018-04-07 MED ORDER — GLYCOPYRROLATE 0.2 MG/ML IJ SOLN
INTRAMUSCULAR | Status: DC | PRN
  Administered 2018-04-07: 0.1 mg via INTRAVENOUS

## 2018-04-07 MED ORDER — PHENYLEPHRINE HCL 10 MG/ML IJ SOLN
INTRAMUSCULAR | Status: DC | PRN
  Administered 2018-04-07: 80 ug via INTRAVENOUS

## 2018-04-07 MED ORDER — MENTHOL 3 MG MT LOZG: 1.0000 | LOZENGE | OROMUCOSAL | Status: DC | PRN

## 2018-04-07 MED ORDER — BUPIVACAINE IN DEXTROSE 0.75-8.25 % IT SOLN
INTRATHECAL | Status: DC | PRN
  Administered 2018-04-07: 1.8 mL via INTRATHECAL

## 2018-04-07 MED ORDER — FENTANYL CITRATE (PF) 100 MCG/2ML IJ SOLN
INTRAMUSCULAR | Status: DC | PRN
  Administered 2018-04-07: 10 ug via INTRATHECAL

## 2018-04-07 MED ORDER — MEASLES, MUMPS & RUBELLA VAC ~~LOC~~ INJ: 0.5000 mL | INJECTION | Freq: Once | SUBCUTANEOUS | Status: DC

## 2018-04-07 MED ORDER — DIBUCAINE 1 % RE OINT: 1.0000 "application " | TOPICAL_OINTMENT | RECTAL | Status: DC | PRN

## 2018-04-07 MED ORDER — ACETAMINOPHEN 325 MG PO TABS
650.0000 mg | ORAL_TABLET | ORAL | Status: DC | PRN
  Administered 2018-04-07 – 2018-04-08 (×4): 650 mg via ORAL
  Filled 2018-04-07 (×4): qty 2

## 2018-04-07 MED ORDER — EPHEDRINE SULFATE 50 MG/ML IJ SOLN
INTRAMUSCULAR | Status: DC | PRN
  Administered 2018-04-07 (×2): 5 mg via INTRAVENOUS

## 2018-04-07 MED ORDER — NALOXONE HCL 0.4 MG/ML IJ SOLN: 0.4000 mg | INTRAMUSCULAR | Status: DC | PRN

## 2018-04-07 MED ORDER — ZOLPIDEM TARTRATE 5 MG PO TABS: 5.0000 mg | ORAL_TABLET | Freq: Every evening | ORAL | Status: DC | PRN

## 2018-04-07 MED ORDER — OXYCODONE HCL 5 MG/5ML PO SOLN: 5.0000 mg | Freq: Once | ORAL | Status: DC | PRN

## 2018-04-07 MED ORDER — CEFOTETAN DISODIUM-DEXTROSE 2-2.08 GM-%(50ML) IV SOLR
2.0000 g | INTRAVENOUS | Status: AC
  Administered 2018-04-07: 2 g via INTRAVENOUS
  Filled 2018-04-07: qty 50

## 2018-04-07 MED ORDER — SIMETHICONE 80 MG PO CHEW: 80.0000 mg | CHEWABLE_TABLET | ORAL | Status: DC | PRN

## 2018-04-07 MED ORDER — PRENATAL MULTIVITAMIN CH
1.0000 | ORAL_TABLET | Freq: Every day | ORAL | Status: DC
  Administered 2018-04-08: 1 via ORAL
  Filled 2018-04-07: qty 1

## 2018-04-07 MED ORDER — FENTANYL CITRATE (PF) 100 MCG/2ML IJ SOLN: 25.0000 ug | INTRAMUSCULAR | Status: DC | PRN

## 2018-04-07 MED ORDER — OXYCODONE HCL 5 MG PO TABS: 5.0000 mg | ORAL_TABLET | Freq: Once | ORAL | Status: DC | PRN

## 2018-04-07 MED ORDER — KETOROLAC TROMETHAMINE 30 MG/ML IJ SOLN
30.0000 mg | Freq: Four times a day (QID) | INTRAMUSCULAR | Status: DC | PRN
  Administered 2018-04-07: 30 mg via INTRAMUSCULAR

## 2018-04-07 MED ORDER — NALBUPHINE HCL 10 MG/ML IJ SOLN: 5.0000 mg | Freq: Once | INTRAMUSCULAR | Status: DC | PRN

## 2018-04-07 MED ORDER — SODIUM CHLORIDE 0.9% FLUSH
3.0000 mL | INTRAVENOUS | Status: DC | PRN
Start: 2018-04-07 — End: 2018-04-09

## 2018-04-07 MED ORDER — KETOROLAC TROMETHAMINE 30 MG/ML IJ SOLN
INTRAMUSCULAR | Status: AC
  Filled 2018-04-07: qty 1

## 2018-04-07 MED ORDER — SIMETHICONE 80 MG PO CHEW
80.0000 mg | CHEWABLE_TABLET | Freq: Three times a day (TID) | ORAL | Status: DC
  Administered 2018-04-07 – 2018-04-09 (×5): 80 mg via ORAL
  Filled 2018-04-07 (×5): qty 1

## 2018-04-07 MED ORDER — WITCH HAZEL-GLYCERIN EX PADS: 1.0000 "application " | MEDICATED_PAD | CUTANEOUS | Status: DC | PRN

## 2018-04-07 MED ORDER — DIPHENHYDRAMINE HCL 25 MG PO CAPS: 25.0000 mg | ORAL_CAPSULE | Freq: Four times a day (QID) | ORAL | Status: DC | PRN

## 2018-04-07 MED ORDER — OXYCODONE HCL 5 MG PO TABS
5.0000 mg | ORAL_TABLET | ORAL | Status: DC | PRN
  Administered 2018-04-08 – 2018-04-09 (×4): 5 mg via ORAL
  Filled 2018-04-07 (×4): qty 1

## 2018-04-07 MED ORDER — OXYTOCIN 10 UNIT/ML IJ SOLN
INTRAMUSCULAR | Status: AC
  Filled 2018-04-07: qty 4

## 2018-04-07 MED ORDER — ONDANSETRON HCL 4 MG/2ML IJ SOLN
INTRAMUSCULAR | Status: AC
  Filled 2018-04-07: qty 2

## 2018-04-07 MED ORDER — DEXAMETHASONE SODIUM PHOSPHATE 10 MG/ML IJ SOLN
INTRAMUSCULAR | Status: DC | PRN
  Administered 2018-04-07: 10 mg via INTRAVENOUS

## 2018-04-07 MED ORDER — OXYCODONE HCL 5 MG PO TABS: 10.0000 mg | ORAL_TABLET | ORAL | Status: DC | PRN

## 2018-04-07 MED ORDER — MORPHINE SULFATE (PF) 0.5 MG/ML IJ SOLN
INTRAMUSCULAR | Status: DC | PRN
  Administered 2018-04-07: .2 mg via INTRATHECAL

## 2018-04-07 MED ORDER — OXYTOCIN 40 UNITS IN LACTATED RINGERS INFUSION - SIMPLE MED: 2.5000 [IU]/h | INTRAVENOUS | Status: AC

## 2018-04-07 MED ORDER — MORPHINE SULFATE (PF) 0.5 MG/ML IJ SOLN
INTRAMUSCULAR | Status: AC
  Filled 2018-04-07: qty 10

## 2018-04-07 MED ORDER — DIPHENHYDRAMINE HCL 25 MG PO CAPS: 25.0000 mg | ORAL_CAPSULE | ORAL | Status: DC | PRN

## 2018-04-07 MED ORDER — OXYTOCIN 10 UNIT/ML IJ SOLN
INTRAMUSCULAR | Status: DC | PRN
  Administered 2018-04-07: 40 [IU] via INTRAVENOUS

## 2018-04-07 MED ORDER — ONDANSETRON HCL 4 MG/2ML IJ SOLN
INTRAMUSCULAR | Status: DC | PRN
Start: 2018-04-07 — End: 2018-04-07
  Administered 2018-04-07: 4 mg via INTRAVENOUS

## 2018-04-07 SURGICAL SUPPLY — 31 items
BENZOIN TINCTURE PRP APPL 2/3 (GAUZE/BANDAGES/DRESSINGS) ×3 IMPLANT
CHLORAPREP W/TINT 26ML (MISCELLANEOUS) ×3 IMPLANT
CLAMP CORD UMBIL (MISCELLANEOUS) IMPLANT
CLOSURE STERI STRIP 1/2 X4 (GAUZE/BANDAGES/DRESSINGS) ×3 IMPLANT
CLOTH BEACON ORANGE TIMEOUT ST (SAFETY) ×3 IMPLANT
DRSG OPSITE POSTOP 4X10 (GAUZE/BANDAGES/DRESSINGS) ×6 IMPLANT
ELECT REM PT RETURN 9FT ADLT (ELECTROSURGICAL) ×3
ELECTRODE REM PT RTRN 9FT ADLT (ELECTROSURGICAL) ×1 IMPLANT
EXTRACTOR VACUUM M CUP 4 TUBE (SUCTIONS) IMPLANT
EXTRACTOR VACUUM M CUP 4' TUBE (SUCTIONS)
GAUZE SPONGE 4X4 12PLY STRL LF (GAUZE/BANDAGES/DRESSINGS) ×6 IMPLANT
GLOVE BIOGEL PI IND STRL 7.0 (GLOVE) ×1 IMPLANT
GLOVE BIOGEL PI INDICATOR 7.0 (GLOVE) ×2
GLOVE SURG ORTHO 8.0 STRL STRW (GLOVE) ×3 IMPLANT
GOWN STRL REUS W/TWL LRG LVL3 (GOWN DISPOSABLE) ×6 IMPLANT
KIT ABG SYR 3ML LUER SLIP (SYRINGE) ×3 IMPLANT
NEEDLE HYPO 25X5/8 SAFETYGLIDE (NEEDLE) ×3 IMPLANT
NS IRRIG 1000ML POUR BTL (IV SOLUTION) ×3 IMPLANT
PACK C SECTION WH (CUSTOM PROCEDURE TRAY) ×3 IMPLANT
PAD ABD 7.5X8 STRL (GAUZE/BANDAGES/DRESSINGS) ×3 IMPLANT
PAD OB MATERNITY 4.3X12.25 (PERSONAL CARE ITEMS) ×3 IMPLANT
PENCIL SMOKE EVAC W/HOLSTER (ELECTROSURGICAL) ×3 IMPLANT
SUT MNCRL 0 VIOLET CTX 36 (SUTURE) ×3 IMPLANT
SUT MON AB 4-0 PS1 27 (SUTURE) ×3 IMPLANT
SUT MONOCRYL 0 CTX 36 (SUTURE) ×6
SUT PDS AB 1 CT  36 (SUTURE)
SUT PDS AB 1 CT 36 (SUTURE) IMPLANT
SUT VIC AB 1 CTX 36 (SUTURE)
SUT VIC AB 1 CTX36XBRD ANBCTRL (SUTURE) IMPLANT
TOWEL OR 17X24 6PK STRL BLUE (TOWEL DISPOSABLE) ×3 IMPLANT
TRAY FOLEY W/BAG SLVR 14FR LF (SET/KITS/TRAYS/PACK) ×3 IMPLANT

## 2018-04-07 NOTE — Anesthesia Procedure Notes (Signed)
Spinal  Patient location during procedure: OR Start time: 04/07/2018 7:37 AM End time: 04/07/2018 7:40 AM Staffing Anesthesiologist: Achille Rich, MD Performed: anesthesiologist  Preanesthetic Checklist Completed: patient identified, surgical consent, pre-op evaluation, timeout performed, IV checked, risks and benefits discussed and monitors and equipment checked Spinal Block Patient position: sitting Prep: DuraPrep Patient monitoring: cardiac monitor, continuous pulse ox and blood pressure Approach: midline Location: L3-4 Injection technique: single-shot Needle Needle type: Pencan  Needle gauge: 24 G Needle length: 9 cm Assessment Sensory level: T10 Additional Notes Functioning IV was confirmed and monitors were applied. Sterile prep and drape, including hand hygiene and sterile gloves were used. The patient was positioned and the spine was prepped. The skin was anesthetized with lidocaine.  Free flow of clear CSF was obtained prior to injecting local anesthetic into the CSF.  The spinal needle aspirated freely following injection.  The needle was carefully withdrawn.  The patient tolerated the procedure well.

## 2018-04-07 NOTE — Transfer of Care (Signed)
Immediate Anesthesia Transfer of Care Note  Patient: Ellen Bates  Procedure(s) Performed: REPEAT CESAREAN SECTION (N/A )  Patient Location: PACU  Anesthesia Type:Spinal  Level of Consciousness: awake, alert  and oriented  Airway & Oxygen Therapy: Patient Spontanous Breathing  Post-op Assessment: Report given to RN and Post -op Vital signs reviewed and stable  Post vital signs: Reviewed and stable  Last Vitals:  Vitals Value Taken Time  BP 94/55 04/07/2018  8:40 AM  Temp    Pulse 63 04/07/2018  8:42 AM  Resp 11 04/07/2018  8:42 AM  SpO2 99 % 04/07/2018  8:42 AM  Vitals shown include unvalidated device data.  Last Pain:  Vitals:   04/07/18 0603  TempSrc: Oral  PainSc:          Complications: No apparent anesthesia complications

## 2018-04-07 NOTE — Progress Notes (Signed)
Called to room due to patient having multiple temperatures at 6106F. Patient was on Bearhugger body warmer. She was completely asymptomatic w/o F/C, abdominal pain.   VSS mildy bradycardic 50s, BP 110/60, SORA  Repeat Oral Temp: 97.106F  PE:  Gen: NAD, mentating well, A&Ox4 CVS: regular rhythm, nl s1/s2 LUNGs: CTAB Abdomen: soft, dressing was clean w/o any blood stain  A/P Asymptomatic hypothermia s/p C-section in AM. Given pt has no s/s and is well appearing w/ improvement in temperature s/p bear hugger. Hypothermia is likely from environmental heat loss, do not believe that it is an infection.  - cont bear hugger - monitor temp, s/s of infxn closely

## 2018-04-07 NOTE — Anesthesia Postprocedure Evaluation (Signed)
Anesthesia Post Note  Patient: Ellen Bates  Procedure(s) Performed: REPEAT CESAREAN SECTION (N/A )     Patient location during evaluation: Mother Baby Anesthesia Type: Spinal Level of consciousness: awake, awake and alert and oriented Pain management: pain level controlled Vital Signs Assessment: post-procedure vital signs reviewed and stable Respiratory status: spontaneous breathing and nonlabored ventilation Cardiovascular status: blood pressure returned to baseline and stable (pt temperature low post op (94, rectal), at time of post op had improved to 97.4 oral ) Postop Assessment: spinal receding and patient able to bend at knees Anesthetic complications: no    Last Vitals:  Vitals:   04/07/18 1350 04/07/18 1450  BP: 117/64   Pulse: 63   Resp: 18   Temp: 36.5 C 36.6 C  SpO2:      Last Pain:  Vitals:   04/07/18 1450  TempSrc: Oral  PainSc: 4    Pain Goal:                 Jennelle Human

## 2018-04-07 NOTE — Progress Notes (Signed)
Patient brought out from PACU at 1050, S/P repeat C-section [phone]. Temp=94.2 rectal using digital thermometer. No reading achieved orally or axillary using many different Lennart Pall devices. Unable to contact anestheiologist so, I contacted Dr. Rana Snare and Dr. Marcelle Overlie. Orders obtained. CBC ordered and Baer heating system applied. Patient states she does not feel cold and is alert and oriented. Dr. Chaney Malling called and was updated on patient's temperature and interventions being done. Temperature rechecked after 30 minutes of heating system. Temp= 97.4 oral

## 2018-04-07 NOTE — Lactation Note (Signed)
This note was copied from a baby's chart. Lactation Consultation Note  Patient Name: Ellen Bates [id number] Date: 04/07/2018 Reason for consult: Initial assessment;Term Breastfeeding consultation services and support information given to patient.  This is her second baby and newborn is 5 hours old.  Baby fed well in the PACU.  Baby is currently sleeping in visitors arms.  Instructed to watch for feeding cues and call for assist prn.  Maternal Data Does the patient have breastfeeding experience prior to this delivery?: Yes  Feeding    LATCH Score                   Interventions    Lactation Tools Discussed/Used     Consult Status Consult Status: Follow-up Date: 04/08/18 Follow-up type: In-patient    Huston Foley 04/07/2018, 1:51 PM

## 2018-04-07 NOTE — Anesthesia Preprocedure Evaluation (Signed)
Anesthesia Evaluation  Patient identified by MRN, date of birth, ID band Patient awake    Reviewed: Allergy & Precautions, H&P , NPO status , Patient's Chart, lab work & pertinent test results  Airway Mallampati: II   Neck ROM: full    Dental   Pulmonary neg pulmonary ROS,    breath sounds clear to auscultation       Cardiovascular negative cardio ROS   Rhythm:regular Rate:Normal     Neuro/Psych    GI/Hepatic   Endo/Other    Renal/GU      Musculoskeletal   Abdominal   Peds  Hematology PLTS 98,000   Anesthesia Other Findings   Reproductive/Obstetrics (+) Pregnancy                             Anesthesia Physical Anesthesia Plan  ASA: II  Anesthesia Plan: Spinal   Post-op Pain Management:    Induction: Intravenous  PONV Risk Score and Plan: 2 and Ondansetron, Dexamethasone and Treatment may vary due to age or medical condition  Airway Management Planned: Simple Face Mask  Additional Equipment:   Intra-op Plan:   Post-operative Plan:   Informed Consent: I have reviewed the patients History and Physical, chart, labs and discussed the procedure including the risks, benefits and alternatives for the proposed anesthesia with the patient or authorized representative who has indicated his/her understanding and acceptance.     Plan Discussed with: CRNA, Anesthesiologist and Surgeon  Anesthesia Plan Comments:         Anesthesia Quick Evaluation

## 2018-04-07 NOTE — Op Note (Signed)
Cesarean Section Procedure Note  Pre-operative Diagnosis: IUP at 39 weeks, prev C/S desires repeat  Post-operative Diagnosis: same  Surgeon: Turner Daniels   Assistants: Surg Tech  Anesthesia: spinal  Procedure:  Low Segment Transverse cesarean section  Procedure Details  The patient was seen in the Holding Room. The risks, benefits, complications, treatment options, and expected outcomes were discussed with the patient.  The patient concurred with the proposed plan, giving informed consent.  The site of surgery properly noted/marked.. A Time Out was held and the above information confirmed.  After induction of anesthesia, the patient was draped and prepped in the usual sterile manner. A Pfannenstiel incision was made and carried down through the subcutaneous tissue to the fascia. Fascial incision was made and extended transversely. The fascia was separated from the underlying rectus tissue superiorly and inferiorly. The peritoneum was identified and entered. Peritoneal incision was extended longitudinally. The utero-vesical peritoneal reflection was incised transversely and the bladder flap was bluntly freed from the lower uterine segment. A low transverse uterine incision was made. Delivered from vertex presentation was a baby with Apgar scores of 9 at one minute and 9 at five minutes. After the umbilical cord was clamped and cut cord blood was obtained for evaluation. The placenta was removed intact and appeared normal. The uterine outline, tubes and ovaries appeared normal. The uterine incision was closed with running locked sutures of 0 monocryl and imbricated with 0 monocryl. Hemostasis was observed. Lavage was carried out until clear. The peritoneum was then closed with 0 monocryl and rectus muscles plicated in the midline.  After hemostasis was assured, the fascia was then reapproximated with running sutures of 0 Vicryl. Irrigation was applied and after adequate hemostasis was assured, the  skin was reapproximated with subcutaneous sutures using 4-0 monocryl.  Instrument, sponge, and needle counts were correct prior the abdominal closure and at the conclusion of the case. The patient received 2 grams cefotetan preoperatively.  Findings: Viable female  Estimated Blood Loss:  800cc         Specimens: Placenta was sent to labor and delivery         Complications:  None

## 2018-04-07 NOTE — Progress Notes (Signed)
Patient initial temperature in PACU was 97.5 F orally.  Patient temperature rechecked multiple times and could not get a reading orally or axillary.  Warm blankets, bair hugger, and warm IV fluids given to patient and could still not get a temperature reading orally or axillary.  Patient was alert and oriented and stated she did not feel cold; all other vital signs normal.  Patient rectal temp 94.4 F at 1044.  Dr. Chaney Malling notified about temperature and interventions in place.  No new orders given and said patient could transfer to mother baby.  Report given to [place] including information about the patients temperature and that the MD is aware.

## 2018-04-07 NOTE — H&P (Signed)
Smt Lokey is a 32 y.o. female presenting for repeat c/s at term.  Pregnancy uncomplicated.  GBS - (previous c/s for previa). OB History    Gravida  2   Para  1   Term      Preterm  1   AB      Living  1     SAB      TAB      Ectopic      Multiple  0   Live Births  1          Past Medical History:  Diagnosis Date  . Anemia    7-10 years ago   . 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 Medical history non-contributory    Past Surgical History:  Procedure Laterality Date  . CESAREAN SECTION N/A 12/12/2015   Procedure: CESAREAN SECTION;  Surgeon: [name], MD;  Location: [facility];  Service: Obstetrics;  Laterality: N/A;   edc 01/05/16   . WISDOM TOOTH EXTRACTION     Family History: family history includes Heart disease in her maternal grandfather; Hyperlipidemia in her mother; Lung cancer in her paternal grandmother. Social History:  reports that she has never smoked. She has never used smokeless tobacco. She reports that she does not drink alcohol or use drugs.     Maternal Diabetes: No Genetic Screening: Normal Maternal Ultrasounds/Referrals: Normal Fetal Ultrasounds or other Referrals:  None Maternal Substance Abuse:  No Significant Maternal Medications:  None Significant Maternal Lab Results:  None Other Comments:  None  ROS History   Blood pressure 107/66, pulse 74, temperature 97.8 F (36.6 C), temperature source Oral, resp. rate 18, height  (1.702 m), weight 138 lb 14.4 oz (63 kg), last menstrual period 07/07/2017, unknown if currently breastfeeding. Exam Physical Exam   Cl th high cervix Prenatal labs: ABO, Rh: --/--/B POS (05/16 1115) Antibody: NEG (05/16 1115) Rubella: Immune (10/25 0000) RPR: Non Reactive (05/16 1115)  HBsAg: Negative (10/25 0000)  HIV: Non-reactive (10/25 0000)  GBS:     Assessment/Plan: IUP at 39 weeks Prev C/S desires repeat Risks and benefits of C/S were discussed.  All questions were  answered and informed consent was obtained.  Plan to proceed with low segment transverse Cesarean Section. This patient has been seen and examined.   All of her questions were answered.  Labs and vital signs reviewed.  Informed consent has been obtained.  The History and Physical is current.  DL  Annalia Metzger C 1/61/0960, 7:27 AM

## 2018-04-08 LAB — CBC
HCT: 27.2 % — ABNORMAL LOW (ref 36.0–46.0)
Hemoglobin: 9.3 g/dL — ABNORMAL LOW (ref 12.0–15.0)
MCH: 31.4 pg (ref 26.0–34.0)
MCHC: 34.2 g/dL (ref 30.0–36.0)
MCV: 91.9 fL (ref 78.0–100.0)
PLATELETS: 81 10*3/uL — AB (ref 150–400)
RBC: 2.96 MIL/uL — AB (ref 3.87–5.11)
RDW: 13.2 % (ref 11.5–15.5)
WBC: 11.4 10*3/uL — ABNORMAL HIGH (ref 4.0–10.5)

## 2018-04-08 NOTE — Progress Notes (Signed)
Subjective: Postpartum Day 1: Cesarean Delivery Patient reports tolerating PO.    Objective: Vital signs in last 24 hours: Temp:  [94.2 F (34.6 C)-99 F (37.2 C)] 98.9 F (37.2 C) (05/18 0810) Pulse Rate:  [51-77] 63 (05/18 0810) Resp:  [12-19] 17 (05/18 0810) BP: (90-117)/(54-74) 93/74 (05/18 0810) SpO2:  [96 %-100 %] 98 % (05/18 0810)  Physical Exam:  General: alert Lochia: appropriate Uterine Fundus: firm Incision: healing well DVT Evaluation: No evidence of DVT seen on physical exam.  CBC    Component Value Date/Time   WBC 12.9 (H) 04/07/2018 1306   RBC 3.58 (L) 04/07/2018 1306   HGB 11.2 (L) 04/07/2018 1306   HCT 32.7 (L) 04/07/2018 1306   PLT 86 (L) 04/07/2018 1306   MCV 91.3 04/07/2018 1306   MCH 31.3 04/07/2018 1306   MCHC 34.3 04/07/2018 1306   RDW 12.8 04/07/2018 1306   LYMPHSABS 3.1 11/27/2015 0703   MONOABS 0.8 11/27/2015 0703   EOSABS 0.1 11/27/2015 0703   BASOSABS 0.0 11/27/2015 0703     Assessment/Plan: Status post Cesarean section.  Continue current care Cbc in am to follow plts.  Elgie Maziarz Milana Obey 04/08/2018, 8:36 AM

## 2018-04-09 ENCOUNTER — Encounter (HOSPITAL_COMMUNITY): Payer: Self-pay | Admitting: Obstetrics and Gynecology

## 2018-04-09 LAB — CBC
HEMATOCRIT: 31.7 % — AB (ref 36.0–46.0)
Hemoglobin: 10.5 g/dL — ABNORMAL LOW (ref 12.0–15.0)
MCH: 30.9 pg (ref 26.0–34.0)
MCHC: 33.1 g/dL (ref 30.0–36.0)
MCV: 93.2 fL (ref 78.0–100.0)
PLATELETS: 110 10*3/uL — AB (ref 150–400)
RBC: 3.4 MIL/uL — ABNORMAL LOW (ref 3.87–5.11)
RDW: 13.4 % (ref 11.5–15.5)
WBC: 9.1 10*3/uL (ref 4.0–10.5)

## 2018-04-09 MED ORDER — IBUPROFEN 800 MG PO TABS
800.0000 mg | ORAL_TABLET | Freq: Three times a day (TID) | ORAL | Status: DC | PRN
  Administered 2018-04-09: 800 mg via ORAL
  Filled 2018-04-09: qty 1

## 2018-04-09 MED ORDER — OXYCODONE HCL 5 MG PO TABS: 5.0000 mg | ORAL_TABLET | ORAL | 0 refills | Status: DC | PRN

## 2018-04-09 MED ORDER — IBUPROFEN 800 MG PO TABS: 800.0000 mg | ORAL_TABLET | Freq: Three times a day (TID) | ORAL | 0 refills | Status: DC | PRN

## 2018-04-09 NOTE — Discharge Summary (Signed)
Obstetric Discharge Summary Reason for Admission: cesarean section Prenatal Procedures: none Intrapartum Procedures: cesarean: low cervical, transverse Postpartum Procedures: none Complications-Operative and Postpartum: none CBC    Component Value Date/Time   WBC 9.1 04/09/2018 0522   RBC 3.40 (L) 04/09/2018 0522   HGB 10.5 (L) 04/09/2018 0522   HCT 31.7 (L) 04/09/2018 0522   PLT 110 (L) 04/09/2018 0522   MCV 93.2 04/09/2018 0522   MCH 30.9 04/09/2018 0522   MCHC 33.1 04/09/2018 0522   RDW 13.4 04/09/2018 0522   LYMPHSABS 3.1 11/27/2015 0703   MONOABS 0.8 11/27/2015 0703   EOSABS 0.1 11/27/2015 0703   BASOSABS 0.0 11/27/2015 0703     Physical Exam:  General: alert Lochia: appropriate Uterine Fundus: firm Incision: healing well DVT Evaluation: No evidence of DVT seen on physical exam.  Discharge Diagnoses: Term Pregnancy-delivered  Discharge Information: Date: 04/09/2018 Activity: pelvic rest Diet: routine Medications: PNV, Ibuprofen and Percocet Condition: stable Instructions: refer to practice specific booklet Discharge to: home Follow-up Information    Ellen Camp, MD. Schedule an appointment as soon as possible for a visit in 1 week(s).   Specialty:  Obstetrics and Gynecology Contact information: [address] [place], [address] [place] [address] [phone]           Newborn Data: Live born female  Birth Weight: 6 lb 15.5 oz (3160 g) APGAR: 9, 9  Newborn Delivery   Birth date/time:  04/07/2018 07:57:00 Delivery type:  C-Section, Low Transverse Trial of labor:  No C-section categorization:  Repeat     Home with mother.  Ellen Bates 04/09/2018, 10:52 AM

## 2018-04-09 NOTE — Progress Notes (Signed)
Subjective: Postpartum Day 2: Cesarean Delivery Patient reports tolerating PO.    Objective: Vital signs in last 24 hours: Temp:  [98.3 F (36.8 C)-98.9 F (37.2 C)] 98.5 F (36.9 C) (05/19 0635) Pulse Rate:  [60-69] 69 (05/19 0635) Resp:  [17-18] 18 (05/19 0635) BP: (93-104)/(60-75) 104/75 (05/19 0635) SpO2:  [98 %] 98 % (05/19 1610)  Physical Exam:  General: alert Lochia: appropriate Uterine Fundus: firm Incision: healing well DVT Evaluation: No evidence of DVT seen on physical exam.  CBC    Component Value Date/Time   WBC 9.1 04/09/2018 0522   RBC 3.40 (L) 04/09/2018 0522   HGB 10.5 (L) 04/09/2018 0522   HCT 31.7 (L) 04/09/2018 0522   PLT 110 (L) 04/09/2018 0522   MCV 93.2 04/09/2018 0522   MCH 30.9 04/09/2018 0522   MCHC 33.1 04/09/2018 0522   RDW 13.4 04/09/2018 0522   LYMPHSABS 3.1 11/27/2015 0703   MONOABS 0.8 11/27/2015 0703   EOSABS 0.1 11/27/2015 0703   BASOSABS 0.0 11/27/2015 0703     Assessment/Plan: Status post Cesarean section. Doing well postoperatively.  Continue current care. PLTS improved>>restart PO Motrin prn Plan D/C in am Meriel Pica 04/09/2018, 7:01 AM

## 2019-04-27 ENCOUNTER — Other Ambulatory Visit: Payer: Self-pay

## 2019-04-27 ENCOUNTER — Encounter (HOSPITAL_COMMUNITY): Payer: Self-pay

## 2019-04-27 ENCOUNTER — Emergency Department (HOSPITAL_COMMUNITY)
Admission: EM | Admit: 2019-04-27 | Discharge: 2019-04-27 | Disposition: A | Discharge status: Home / Self Care | Payer: BC Managed Care – PPO | Attending: Emergency Medicine | Admitting: Emergency Medicine

## 2019-04-27 ENCOUNTER — Emergency Department (HOSPITAL_COMMUNITY): Payer: BC Managed Care – PPO

## 2019-04-27 DIAGNOSIS — R002 Palpitations: Secondary | ICD-10-CM | POA: Insufficient documentation

## 2019-04-27 DIAGNOSIS — R0789 Other chest pain: Secondary | ICD-10-CM | POA: Diagnosis not present

## 2019-04-27 LAB — CBC WITH DIFFERENTIAL/PLATELET
Abs Immature Granulocytes: 0 10*3/uL (ref 0.00–0.07)
Basophils Absolute: 0 10*3/uL (ref 0.0–0.1)
Basophils Relative: 0 %
Eosinophils Absolute: 0 10*3/uL (ref 0.0–0.5)
Eosinophils Relative: 1 %
HCT: 35.3 % — ABNORMAL LOW (ref 36.0–46.0)
Hemoglobin: 11.9 g/dL — ABNORMAL LOW (ref 12.0–15.0)
Immature Granulocytes: 0 %
Lymphocytes Relative: 29 %
Lymphs Abs: 1.4 10*3/uL (ref 0.7–4.0)
MCH: 30.1 pg (ref 26.0–34.0)
MCHC: 33.7 g/dL (ref 30.0–36.0)
MCV: 89.1 fL (ref 80.0–100.0)
Monocytes Absolute: 0.4 10*3/uL (ref 0.1–1.0)
Monocytes Relative: 7 %
Neutro Abs: 3 10*3/uL (ref 1.7–7.7)
Neutrophils Relative %: 63 %
Platelets: 161 10*3/uL (ref 150–400)
RBC: 3.96 MIL/uL (ref 3.87–5.11)
RDW: 11.5 % (ref 11.5–15.5)
WBC: 4.8 10*3/uL (ref 4.0–10.5)
nRBC: 0 % (ref 0.0–0.2)

## 2019-04-27 LAB — COMPREHENSIVE METABOLIC PANEL
ALT: 35 U/L (ref 0–44)
AST: 31 U/L (ref 15–41)
Albumin: 4.4 g/dL (ref 3.5–5.0)
Alkaline Phosphatase: 59 U/L (ref 38–126)
Anion gap: 6 (ref 5–15)
BUN: 16 mg/dL (ref 6–20)
CO2: 28 mmol/L (ref 22–32)
Calcium: 9.3 mg/dL (ref 8.9–10.3)
Chloride: 106 mmol/L (ref 98–111)
Creatinine, Ser: 0.74 mg/dL (ref 0.44–1.00)
GFR calc Af Amer: >60 mL/min (ref >60)
GFR calc non Af Amer: >60 mL/min (ref >60)
Glucose, Bld: 98 mg/dL (ref 70–99)
Potassium: 3.6 mmol/L (ref 3.5–5.1)
Sodium: 140 mmol/L (ref 135–145)
Total Bilirubin: 0.8 mg/dL (ref 0.3–1.2)
Total Protein: 6.8 g/dL (ref 6.5–8.1)

## 2019-04-27 LAB — D-DIMER, QUANTITATIVE: D-Dimer, Quant: <0.27 ug/mL-FEU (ref 0.00–0.50)

## 2019-04-27 LAB — TSH: TSH: 1.566 u[IU]/mL (ref 0.350–4.500)

## 2019-04-27 LAB — TROPONIN I: Troponin I: <0.03 ng/mL (ref <0.03)

## 2019-04-27 MED ORDER — SODIUM CHLORIDE 0.9 % IV BOLUS
1000.0000 mL | Freq: Once | INTRAVENOUS | Status: AC
  Administered 2019-04-27: 1000 mL via INTRAVENOUS

## 2019-04-27 NOTE — ED Provider Notes (Signed)
[facility] Provider Note   CSN: [id number] Arrival date & time: 04/27/19  1610    History   Chief Complaint Chief Complaint  Patient presents with  . Palpitations  . Chest Pain    HPI Ellen Bates is a 33 y.o. female.     The history is provided by the patient. No language interpreter was used.  Palpitations  Palpitations quality:  Fast Onset quality:  Gradual Duration:  1 day Timing:  Constant Progression:  Worsening Chronicity:  New Relieved by:  Nothing Worsened by:  Nothing Associated symptoms: chest pain   Risk factors: diabetes mellitus   Risk factors: no heart disease, no hx of DVT, no hx of PE, no hx of thyroid disease and no hyperthyroidism   Chest Pain  Associated symptoms: palpitations   Pt reports her heart has been racing and she has felt dizziness and shortness of breath.   Past Medical History:  Diagnosis Date  . Anemia    7-10 years ago   . 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 Medical history non-contributory     Patient Active Problem List   Diagnosis Date Noted  . Cesarean delivery delivered 04/07/2018  . Vaginal bleeding in pregnancy 12/12/2015  . Placenta previa antepartum in third trimester 11/26/2015  . Placenta previa antepartum 10/18/2015    Past Surgical History:  Procedure Laterality Date  . CESAREAN SECTION N/A 12/12/2015   Procedure: CESAREAN SECTION;  Surgeon: Candice Campavid Lowe, MD;  Location: [facility];  Service: Obstetrics;  Laterality: N/A;   edc 01/05/16   . CESAREAN SECTION N/A 04/07/2018   Procedure: REPEAT CESAREAN SECTION;  Surgeon: Candice CampLowe, David, MD;  [facility];  Service: Obstetrics;  Laterality: N/A;  Repeat allergy to sullfa and neosporin need RNFA  . WISDOM TOOTH EXTRACTION       OB History    Gravida  2   Para  2   Term  1   Preterm  1   AB      Living  2     SAB      TAB      Ectopic      Multiple  0   Live Births  2             Home Medications    Prior to Admission medications   Medication Sig Start Date End Date Taking? Authorizing Provider  ibuprofen (ADVIL,MOTRIN) 800 MG tablet Take 1 tablet (800 mg total) by mouth every 8 (eight) hours as needed for moderate pain. 04/09/18   Richarda OverlieHolland, Richard, MD  oxyCODONE (OXY IR/ROXICODONE) 5 MG immediate release tablet Take 1 tablet (5 mg total) by mouth every 4 (four) hours as needed (pain scale 4-7). 04/09/18   Richarda OverlieHolland, Richard, MD  Prenatal Vit-Fe Fumarate-FA (PRENATAL MULTIVITAMIN) TABS tablet Take 1 tablet by mouth daily.     [provider]    Family History Family History  Problem Relation Age of Onset  . Hyperlipidemia Mother   . Heart disease Maternal Grandfather   . Lung cancer Paternal Grandmother     Social History Social History   Tobacco Use  . Smoking status: Never Smoker  . Smokeless tobacco: Never Used  Substance Use Topics  . Alcohol use: No  . Drug use: No     Allergies   Sulfa antibiotics and Neosporin [neomycin-bacitracin zn-polymyx]   Review of Systems Review of Systems  Cardiovascular: Positive for chest pain and palpitations.  All other systems reviewed and are negative.    Physical Exam Updated Vital Signs BP 108/68   Pulse 80   Temp 98.7 F (37.1 C) (Oral)   Resp 12   Ht 5\' 7"  (1.702 m)   Wt 50.8 kg   LMP 04/25/2019 (Within Days)   SpO2 98%   Breastfeeding No   BMI 17.54 kg/m   Physical Exam Vitals signs and nursing note reviewed.  HENT:     Head: Normocephalic.  Eyes:     Pupils: Pupils are equal, round, and reactive to light.  Neck:     Musculoskeletal: Normal range of motion.  Cardiovascular:     Rate and Rhythm: Normal rate and regular rhythm.     Heart sounds: Normal heart sounds.  Pulmonary:     Effort: Pulmonary effort is normal.     Breath sounds: Normal breath sounds.  Abdominal:     General: Bowel sounds are normal.     Palpations: Abdomen is soft.  Musculoskeletal: Normal  range of motion.  Skin:    General: Skin is warm.  Neurological:     General: No focal deficit present.     Mental Status: She is alert.  Psychiatric:        Mood and Affect: Mood normal.      ED Treatments / Results  Labs (all labs ordered are listed, but only abnormal results are displayed) Labs Reviewed  CBC WITH DIFFERENTIAL/PLATELET - Abnormal; Notable for the following components:      Result Value   Hemoglobin 11.9 (*)    HCT 35.3 (*)    All other components within normal limits  COMPREHENSIVE METABOLIC PANEL  TROPONIN I  D-DIMER, QUANTITATIVE (NOT AT [facility])  TSH    EKG None  Radiology Dg Chest Port 1 View  Result Date: 04/27/2019 CLINICAL DATA:  Dyspnea EXAM: PORTABLE CHEST 1 VIEW COMPARISON:  None. FINDINGS: Normal heart size. Normal mediastinal contour. No pneumothorax. No pleural effusion. Lungs appear clear, with no acute consolidative airspace disease and no pulmonary edema. IMPRESSION: No active disease. Electronically Signed   By: Delbert Phenix M.D.   On: 04/27/2019 17:26    Procedures Procedures (including critical care time)  Medications Ordered in ED Medications  sodium chloride 0.9 % bolus 1,000 mL (0 mLs Intravenous Stopped 04/27/19 1808)     Initial Impression / Assessment and Plan / ED Course  I have reviewed the triage vital signs and the nursing notes.  Pertinent labs & imaging results that were available during my care of the patient were reviewed by me and considered in my medical decision making (see chart for details).        MDM  EKG and chest xray are normal.  Troponin and ddimer are normal.  Tsh is normal.   Pt reports she gave up caffeine a week ago.   Pt looks good.  Heart rate is 80s.  Pt worried about her blood pressure being high.  Pt advised all bp readings are normal.  I do not think pt has any hypertensive concerns   Final Clinical Impressions(s) / ED Diagnoses   Final diagnoses:  Heart palpitations    ED Discharge Orders     None    An After Visit Summary was printed and given to the patient.    Osie Cheeks 04/27/19 Thresa Ross, MD 04/27/19 [phone]

## 2019-04-27 NOTE — ED Triage Notes (Signed)
Pt having heart palpitations for a week. Seen PCP but has gotten worse since then. Developed central chest pain last night. Also having  Dizziness, and SOB.

## 2019-04-27 NOTE — Discharge Instructions (Signed)
Your exam today is normal.  Schedule to see your Physician for recheck next week,

## 2019-05-03 ENCOUNTER — Ambulatory Visit: Payer: BC Managed Care – PPO | Admitting: Cardiology

## 2019-05-03 ENCOUNTER — Encounter: Payer: Self-pay | Admitting: Cardiology

## 2019-05-03 ENCOUNTER — Other Ambulatory Visit: Payer: Self-pay

## 2019-05-03 VITALS — BP 107/68 | HR 86 | Temp 98.9°F | Ht 67.0 in | Wt 115.6 lb

## 2019-05-03 DIAGNOSIS — R002 Palpitations: Secondary | ICD-10-CM | POA: Diagnosis not present

## 2019-05-03 NOTE — Progress Notes (Signed)
Patient referred by Chesley Noon, MD for palpitations  Subjective:   Ellen Bates, female    DOB: 1986/07/14, 33 y.o.   MRN: [id number]   Chief Complaint  Patient presents with  . Palpitations  . New Patient (Initial Visit)     HPI  33 y.o. Caucasian female with palpitations  Patient was seen at [facility] ED on 04/27/2019 with complaints of chest pain and palpitations. Workup including EKG, troponin, d-Dimer was unremarkable.   For the past two weeks, she has been experiencing palpitations that occur at random times., last for seconds-minutes, only occasionally associated with chest pressure (as during her ED visit).  At baseline, patient works part time. She is fairly active, taking care of her two children. She walks regularly. She denies exertional chest pain, shortness of breath, leg edema, orthopnea, PND, TIA/syncope.   Past Medical History:  Diagnosis Date  . Anemia    7-10 years ago   . 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 Medical history non-contributory      Past Surgical History:  Procedure Laterality Date  . CESAREAN SECTION N/A 12/12/2015   Procedure: CESAREAN SECTION;  Surgeon: Louretta Shorten, MD;  Location: [facility];  Service: Obstetrics;  Laterality: N/A;   edc 01/05/16   . CESAREAN SECTION N/A 04/07/2018   Procedure: REPEAT CESAREAN SECTION;  Surgeon: Louretta Shorten, MD;  Location: [place];  Service: Obstetrics;  Laterality: N/A;  Repeat allergy to sullfa and neosporin need RNFA  . WISDOM TOOTH EXTRACTION       Social History   Socioeconomic History  . Marital status: Married    Spouse name: Not on file  . Number of children: Not on file  . Years of education: Not on file  . Highest education level: Not on file  Occupational History  . Not on file  Social Needs  . Financial resource strain: Not on file  . Food insecurity    Worry: Not on file    Inability: Not on file  . Transportation needs    Medical: Not on  file    Non-medical: Not on file  Tobacco Use  . Smoking status: Never Smoker  . Smokeless tobacco: Never Used  Substance and Sexual Activity  . Alcohol use: No  . Drug use: No  . Sexual activity: Never  Lifestyle  . Physical activity    Days per week: Not on file    Minutes per session: Not on file  . Stress: Not on file  Relationships  . Social Herbalist on phone: Not on file    Gets together: Not on file    Attends religious service: Not on file    Active member of club or organization: Not on file    Attends meetings of clubs or organizations: Not on file    Relationship status: Not on file  . Intimate partner violence    Fear of current or ex partner: Not on file    Emotionally abused: Not on file    Physically abused: Not on file    Forced sexual activity: Not on file  Other Topics Concern  . Not on file  Social History Narrative  . Not on file     Family History  Problem Relation Age of Onset  . Hyperlipidemia Mother   . Heart disease Maternal Grandfather   . Lung cancer Paternal Grandmother      Current Outpatient Medications on File  Prior to Visit  Medication Sig Dispense Refill  . ibuprofen (ADVIL,MOTRIN) 800 MG tablet Take 1 tablet (800 mg total) by mouth every 8 (eight) hours as needed for moderate pain. 30 tablet 0  . oxyCODONE (OXY IR/ROXICODONE) 5 MG immediate release tablet Take 1 tablet (5 mg total) by mouth every 4 (four) hours as needed (pain scale 4-7). 30 tablet 0  . Prenatal Vit-Fe Fumarate-FA (PRENATAL MULTIVITAMIN) TABS tablet Take 1 tablet by mouth daily.      No current facility-administered medications on file prior to visit.     Cardiovascular studies:  EKG 04/27/2019: Sinus rhythm. Normal EKG  Chest Xray 06/0/2020: EXAM: PORTABLE CHEST 1 VIEW  COMPARISON:  None.  FINDINGS: Normal heart size. Normal mediastinal contour. No pneumothorax. No pleural effusion. Lungs appear clear, with no acute consolidative  airspace disease and no pulmonary edema.  IMPRESSION: No active disease.   Recent labs: Results for Ellen Bates, Chasey (MRN [id number]) as of 05/03/2019 09:23  Ref. Range 04/27/2019 16:58  COMPREHENSIVE METABOLIC PANEL Unknown Rpt  Sodium Latest Ref Range: 135 - 145 mmol/L 140  Potassium Latest Ref Range: 3.5 - 5.1 mmol/L 3.6  Chloride Latest Ref Range: 98 - 111 mmol/L 106  CO2 Latest Ref Range: 22 - 32 mmol/L 28  Glucose Latest Ref Range: 70 - 99 mg/dL 98  BUN Latest Ref Range: 6 - 20 mg/dL 16  Creatinine Latest Ref Range: 0.44 - 1.00 mg/dL 8.460.74  Calcium Latest Ref Range: 8.9 - 10.3 mg/dL 9.3  Anion gap Latest Ref Range: 5 - 15  6  Alkaline Phosphatase Latest Ref Range: 38 - 126 U/L 59  Albumin Latest Ref Range: 3.5 - 5.0 g/dL 4.4  AST Latest Ref Range: 15 - 41 U/L 31  ALT Latest Ref Range: 0 - 44 U/L 35  Total Protein Latest Ref Range: 6.5 - 8.1 g/dL 6.8  Total Bilirubin Latest Ref Range: 0.3 - 1.2 mg/dL 0.8  GFR, Est Non African American Latest Ref Range: >60 mL/min >60  GFR, Est African American Latest Ref Range: >60 mL/min >60  Troponin I Latest Ref Range: <0.03 ng/mL <0.03   Results for Ellen Bates, Nylah (MRN [id number]) as of 05/03/2019 09:23  Ref. Range 04/27/2019 16:58  WBC Latest Ref Range: 4.0 - 10.5 K/uL 4.8  RBC Latest Ref Range: 3.87 - 5.11 MIL/uL 3.96  Hemoglobin Latest Ref Range: 12.0 - 15.0 g/dL 32.411.9 (L)  HCT Latest Ref Range: 36.0 - 46.0 % 35.3 (L)  MCV Latest Ref Range: 80.0 - 100.0 fL 89.1  MCH Latest Ref Range: 26.0 - 34.0 pg 30.1  MCHC Latest Ref Range: 30.0 - 36.0 g/dL 40.133.7  RDW Latest Ref Range: 11.5 - 15.5 % 11.5  Platelets Latest Ref Range: 150 - 400 K/uL 161  nRBC Latest Ref Range: 0.0 - 0.2 % 0.0   Results for Ellen Bates, Lashara (MRN [id number]) as of 05/03/2019 09:23  Ref. Range 04/27/2019 16:58  Glucose Latest Ref Range: 70 - 99 mg/dL 98  TSH Latest Ref Range: 0.350 - 4.500 uIU/mL 1.566    Review of Systems  Constitution: Negative for decreased appetite,  malaise/fatigue, weight gain and weight loss.  HENT: Negative for congestion.   Eyes: Negative for visual disturbance.  Cardiovascular: Positive for palpitations. Negative for chest pain, dyspnea on exertion, leg swelling and syncope.  Respiratory: Negative for cough.   Endocrine: Negative for cold intolerance.  Hematologic/Lymphatic: Does not bruise/bleed easily.  Skin: Negative for itching and rash.  Musculoskeletal: Negative for myalgias.  Gastrointestinal: Negative for  abdominal pain, nausea and vomiting.  Genitourinary: Negative for dysuria.  Neurological: Negative for dizziness and weakness.  Psychiatric/Behavioral: The patient is not nervous/anxious.   All other systems reviewed and are negative.        Vitals:   05/03/19 1100  BP: 107/68  Pulse: 86  Temp: 98.9 F (37.2 C)  SpO2: 100%     Body mass index is 18.11 kg/m. Filed Weights   05/03/19 1100  Weight: 52.4 kg     Objective:   Physical Exam  Constitutional: She is oriented to person, place, and time. She appears well-developed and well-nourished. No distress.  HENT:  Head: Normocephalic and atraumatic.  Eyes: Pupils are equal, round, and reactive to light. Conjunctivae are normal.  Neck: No JVD present.  Cardiovascular: Normal rate, regular rhythm and intact distal pulses.  Pulmonary/Chest: Effort normal and breath sounds normal. She has no wheezes. She has no rales.  Abdominal: Soft. Bowel sounds are normal. There is no rebound.  Musculoskeletal:        General: No edema.  Lymphadenopathy:    She has no cervical adenopathy.  Neurological: She is alert and oriented to person, place, and time. No cranial nerve deficit.  Skin: Skin is warm and dry.  Psychiatric: She has a normal mood and affect.  Nursing note and vitals reviewed.         Assessment & Recommendations:   33 y.o. Caucasian female with palpitations  Benign physical exam. Normal EKG with negative d-Dimer and troponin at recent ED  visit. I suspect her palpitations are either benign PAC/PVC's/ sinus arrhythmia or inappropriate sinus tachycardia. My suspicion for serious arrhythmia is low. She is going to undergo thyroid workup with her PCP tomorrow. I offered to perform echocardiogram/event monitor and try metoprolol for symptom management. However, she would like to hold off for now pending her workup with PCP.   I will see her on as needed basis.    Thank you for referring the patient to us. Please feel free to contact with any questions.  Elder NegusManish J Zamorah Ailes, MD [facility] Pager: [phone] Office: [phone] If no answer Cell [phone]

## 2019-08-08 ENCOUNTER — Other Ambulatory Visit: Payer: Self-pay | Admitting: Emergency Medicine

## 2019-08-08 DIAGNOSIS — Z20822 Contact with and (suspected) exposure to covid-19: Secondary | ICD-10-CM

## 2019-08-09 LAB — NOVEL CORONAVIRUS, NAA: SARS-CoV-2, NAA: NOT DETECTED

## 2019-11-12 IMAGING — DX PORTABLE CHEST - 1 VIEW
1 series · 1 of 1 positions shown · non-contrast
Comparison: None.

CLINICAL DATA: Dyspnea

EXAM:
PORTABLE CHEST 1 VIEW

[chest ap]
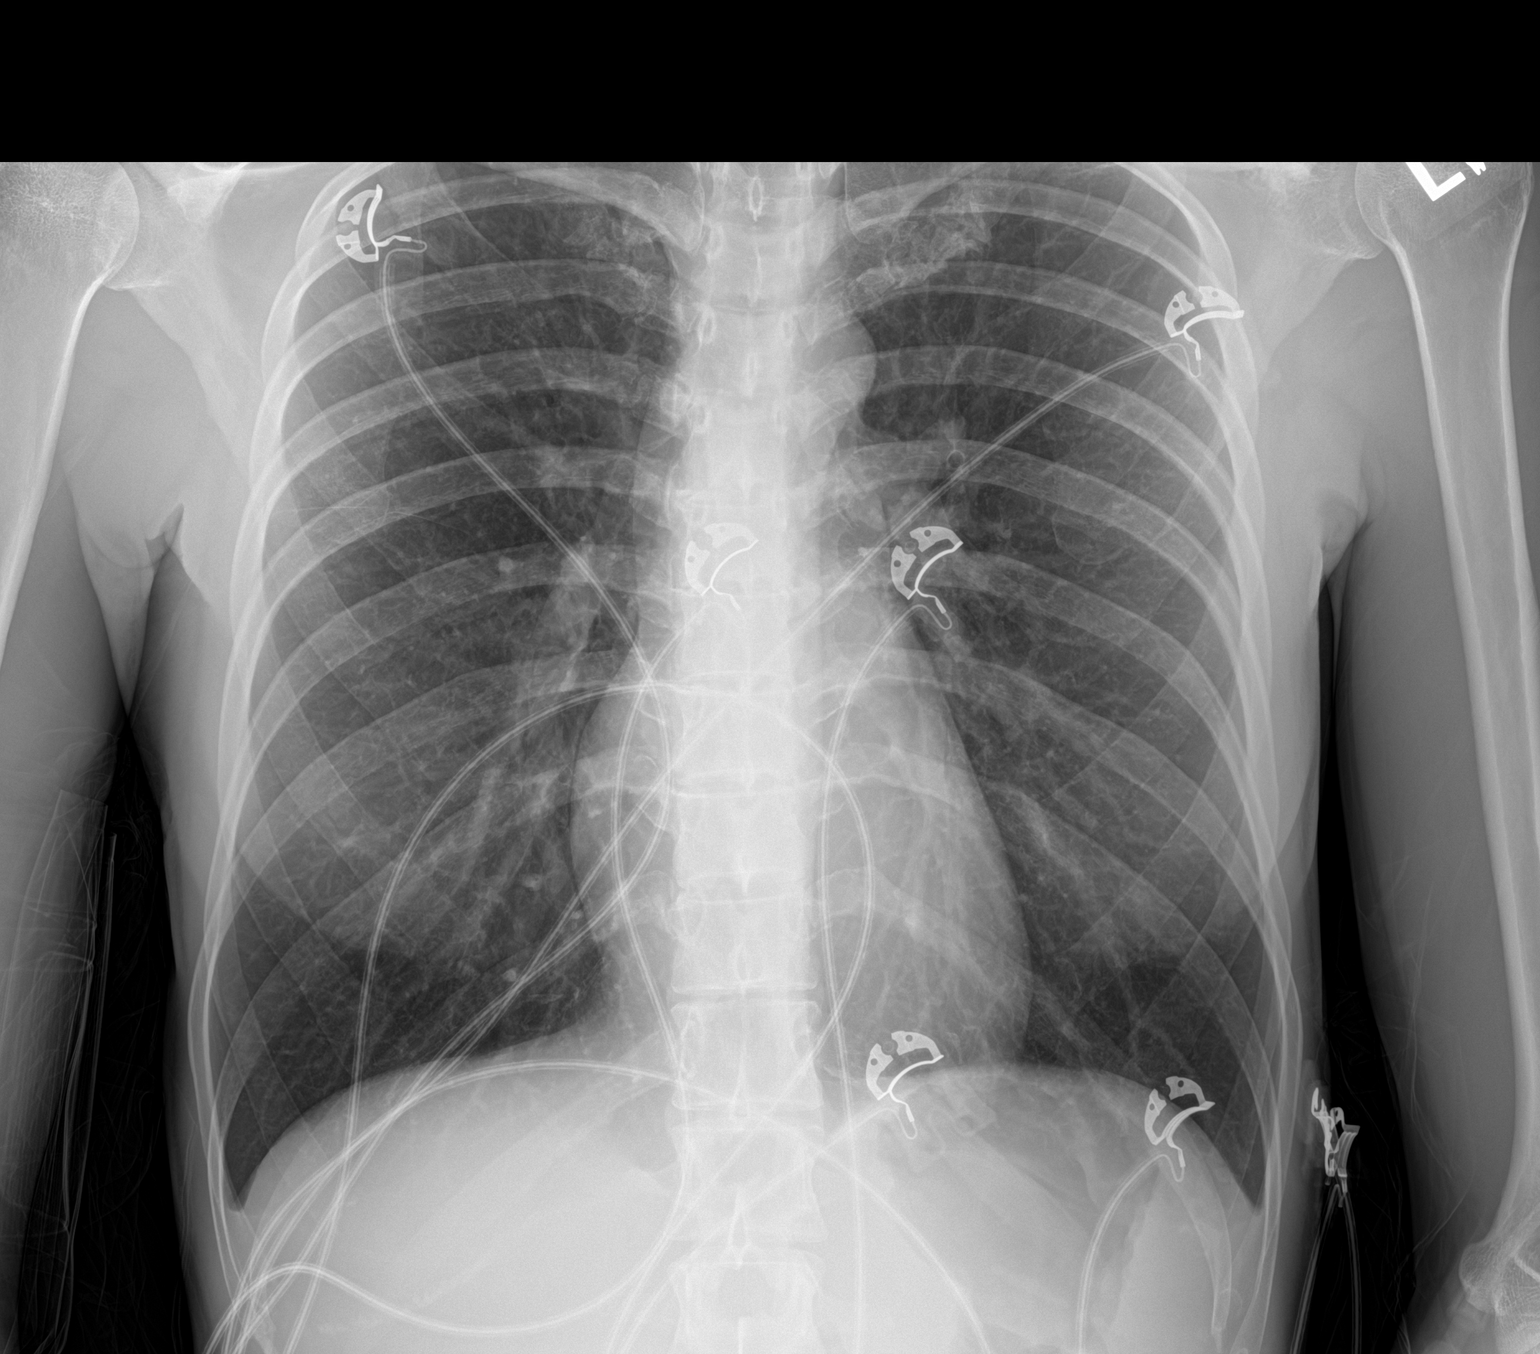

[1 of 1 positions shown; findings below may reference images not displayed]

FINDINGS: Normal heart size. Normal mediastinal contour. No pneumothorax. No
pleural effusion. Lungs appear clear, with no acute consolidative
airspace disease and no pulmonary edema.
IMPRESSION: No active disease.

## 2022-05-04 ENCOUNTER — Encounter (HOSPITAL_COMMUNITY): Payer: Self-pay

## 2022-05-04 ENCOUNTER — Other Ambulatory Visit: Payer: Self-pay

## 2022-05-04 ENCOUNTER — Emergency Department (HOSPITAL_COMMUNITY)
Admission: EM | Admit: 2022-05-04 | Discharge: 2022-05-05 | Discharge status: Against Medical Advice | Payer: 59 | Attending: Student | Admitting: Student

## 2022-05-04 DIAGNOSIS — H5711 Ocular pain, right eye: Secondary | ICD-10-CM | POA: Insufficient documentation

## 2022-05-04 DIAGNOSIS — Z5321 Procedure and treatment not carried out due to patient leaving prior to being seen by health care provider: Secondary | ICD-10-CM | POA: Insufficient documentation

## 2022-05-04 DIAGNOSIS — H538 Other visual disturbances: Secondary | ICD-10-CM | POA: Diagnosis not present

## 2022-05-04 LAB — I-STAT CHEM 8, ED
BUN: 18 mg/dL (ref 6–20)
Calcium, Ion: 1.15 mmol/L (ref 1.15–1.40)
Chloride: 102 mmol/L (ref 98–111)
Creatinine, Ser: 0.8 mg/dL (ref 0.44–1.00)
Glucose, Bld: 101 mg/dL — ABNORMAL HIGH (ref 70–99)
HCT: 35 % — ABNORMAL LOW (ref 36.0–46.0)
Hemoglobin: 11.9 g/dL — ABNORMAL LOW (ref 12.0–15.0)
Potassium: 3.7 mmol/L (ref 3.5–5.1)
Sodium: 139 mmol/L (ref 135–145)
TCO2: 28 mmol/L (ref 22–32)

## 2022-05-04 MED ORDER — TETRACAINE HCL 0.5 % OP SOLN: 2.0000 [drp] | Freq: Once | OPHTHALMIC | Status: DC

## 2022-05-04 MED ORDER — FLUORESCEIN SODIUM 1 MG OP STRP: 1.0000 | ORAL_STRIP | Freq: Once | OPHTHALMIC | Status: DC

## 2022-05-04 NOTE — ED Provider Triage Note (Signed)
Emergency Medicine Provider Triage Evaluation Note  Ellen Bates , a 36 y.o. female  was evaluated in triage.  Pt complains of right eye pain, started this morning, woke up with a watery eye but had gotten more swollen and now there is drainage from the area, no trauma, does wear contacts currently not wearing them did not leave them in the last night, has a history of pinkeye but this feels different..  Review of Systems  Positive: Eye discharge, GERD sensation  Negative: Trauma, fevers  Physical Exam  BP 115/78   Pulse 84   Temp 98.8 F (37.1 C) (Oral)   Resp 19   Ht 5\' 7"  (1.702 m)   Wt 52.4 kg   LMP 04/05/2022   SpO2 100%   BMI 18.09 kg/m  Gen:   Awake, no distress   Resp:  Normal effort  MSK:   Moves extremities without difficulty  Other:    Medical Decision Making  Medically screening exam initiated at 10:46 PM.  Appropriate orders placed.  04/07/2022 was informed that the remainder of the evaluation will be completed by another provider, this initial triage assessment does not replace that evaluation, and the importance of remaining in the ED until their evaluation is complete.  Lab work has been ordered, will need further work-up here in the emergency department.   Doylene Canard, PA-C 05/04/22 2249

## 2022-05-04 NOTE — ED Triage Notes (Signed)
Pt began having right eye swelling, redness, drainage and pain since 10am. Eye is getting worse as the day goes by. Vision is blurry as well. Pt has clear and yellow drainage coming from eye. Has had pink eye before but this does not feel similar

## 2022-05-05 NOTE — ED Notes (Addendum)
Pt left AMA. Pt said she will be going to another facility
